# Patient Record
Sex: Female | Born: 1945 | Race: Black or African American | Hispanic: No | Marital: Married | Smoking: Never smoker
Health system: Southern US, Community
[De-identification: ages and names within clinical notes are randomized; demographics above are authoritative.]

## PROBLEM LIST (undated history)

## (undated) DIAGNOSIS — E119 Type 2 diabetes mellitus without complications: Secondary | ICD-10-CM

## (undated) DIAGNOSIS — I639 Cerebral infarction, unspecified: Secondary | ICD-10-CM

## (undated) DIAGNOSIS — F329 Major depressive disorder, single episode, unspecified: Secondary | ICD-10-CM

## (undated) DIAGNOSIS — G40909 Epilepsy, unspecified, not intractable, without status epilepticus: Secondary | ICD-10-CM

## (undated) DIAGNOSIS — K219 Gastro-esophageal reflux disease without esophagitis: Secondary | ICD-10-CM

## (undated) DIAGNOSIS — F32A Depression, unspecified: Secondary | ICD-10-CM

## (undated) DIAGNOSIS — F0280 Dementia in other diseases classified elsewhere without behavioral disturbance: Secondary | ICD-10-CM

## (undated) DIAGNOSIS — E785 Hyperlipidemia, unspecified: Secondary | ICD-10-CM

## (undated) DIAGNOSIS — I10 Essential (primary) hypertension: Secondary | ICD-10-CM

## (undated) DIAGNOSIS — N390 Urinary tract infection, site not specified: Secondary | ICD-10-CM

## (undated) DIAGNOSIS — G309 Alzheimer's disease, unspecified: Secondary | ICD-10-CM

## (undated) DIAGNOSIS — R339 Retention of urine, unspecified: Secondary | ICD-10-CM

## (undated) HISTORY — PX: HEMIARTHROPLASTY SHOULDER FRACTURE: SUR653

## (undated) HISTORY — PX: APPENDECTOMY: SHX54

## (undated) HISTORY — PX: ABDOMINAL HYSTERECTOMY: SHX81

## (undated) HISTORY — PX: JOINT REPLACEMENT: SHX530

---

## 2005-12-25 ENCOUNTER — Ambulatory Visit: Payer: Self-pay | Admitting: Gastroenterology

## 2006-01-31 ENCOUNTER — Ambulatory Visit: Payer: Self-pay | Admitting: Gastroenterology

## 2006-06-13 ENCOUNTER — Ambulatory Visit: Payer: Self-pay

## 2006-07-11 ENCOUNTER — Ambulatory Visit: Payer: Self-pay | Admitting: Family Medicine

## 2007-06-16 ENCOUNTER — Ambulatory Visit: Payer: Self-pay

## 2008-06-22 ENCOUNTER — Ambulatory Visit: Payer: Self-pay

## 2009-08-09 ENCOUNTER — Ambulatory Visit: Payer: Self-pay

## 2010-03-05 ENCOUNTER — Emergency Department: Payer: Self-pay | Admitting: Emergency Medicine

## 2010-05-22 ENCOUNTER — Ambulatory Visit: Payer: Self-pay | Admitting: Unknown Physician Specialty

## 2010-05-30 ENCOUNTER — Inpatient Hospital Stay: Payer: Self-pay | Admitting: Unknown Physician Specialty

## 2011-01-18 ENCOUNTER — Encounter: Payer: Self-pay | Admitting: Internal Medicine

## 2011-01-18 ENCOUNTER — Ambulatory Visit (INDEPENDENT_AMBULATORY_CARE_PROVIDER_SITE_OTHER): Payer: 59 | Admitting: Internal Medicine

## 2011-01-18 DIAGNOSIS — H8309 Labyrinthitis, unspecified ear: Secondary | ICD-10-CM | POA: Insufficient documentation

## 2011-01-18 DIAGNOSIS — I10 Essential (primary) hypertension: Secondary | ICD-10-CM | POA: Insufficient documentation

## 2011-01-18 DIAGNOSIS — E119 Type 2 diabetes mellitus without complications: Secondary | ICD-10-CM | POA: Insufficient documentation

## 2011-01-23 NOTE — Assessment & Plan Note (Signed)
Summary: INNER EARS  ITCHING   Vital Signs:  Patient Profile:   65 Years Old Female CC:      inner ear x 4 days Temp:     98.2 degrees F Pulse rate:   76 / minute Resp:     14 per minute BP sitting:   126 / 68  (left arm)  Pt. in pain?   no                   Current Allergies: ! ASPIRINHistory of Present Illness Chief Complaint: inner ear x 4 days History of Present Illness: Had same thing last spring which lasted a few weeks after rx per Dr. Thana Ates who is on vacation today. Has unsteadiness but no vertigo. Her ears itch and pop and she is sneezing alot. Used to take allegra but it stopped helping. hearing is no worse than usual.  REVIEW OF SYSTEMS Constitutional Symptoms      Denies fever, chills, night sweats, weight loss, weight gain, and fatigue.  Eyes       Denies change in vision, eye pain, eye discharge, glasses, contact lenses, and eye surgery. Ear/Nose/Throat/Mouth       Complains of change in hearing and dizziness.      Denies hearing loss/aids, ear pain, ear discharge, frequent runny nose, frequent nose bleeds, sinus problems, sore throat, hoarseness, and tooth pain or bleeding.  Respiratory       Denies dry cough, productive cough, wheezing, shortness of breath, asthma, bronchitis, and emphysema/COPD.  Cardiovascular       Denies murmurs, chest pain, and tires easily with exhertion.    Gastrointestinal       Denies stomach pain, nausea/vomiting, diarrhea, constipation, blood in bowel movements, and indigestion. Genitourniary       Denies painful urination, kidney stones, and loss of urinary control. Neurological       Denies paralysis, seizures, and fainting/blackouts. Musculoskeletal       Denies muscle pain, joint pain, joint stiffness, decreased range of motion, redness, swelling, muscle weakness, and gout.  Skin       Denies bruising, unusual mles/lumps or sores, and hair/skin or nail changes.  Psych       Denies mood changes, temper/anger issues,  anxiety/stress, speech problems, depression, and sleep problems.  Past History:  Social History: Last updated: 01/18/2011 Retired Never Smoked Alcohol use-no Drug use-no  Past Medical History: Diabetes mellitus, type II Hypertension gastritis  Past Surgical History: knee replacement  Social History: Retired Never Smoked Alcohol use-no Drug use-no Smoking Status:  never Drug Use:  no Physical Exam General appearance: well developed, well nourished, no acute distress Head: normocephalic, atraumatic Eyes: conjunctivae and lids normal. slight increase in bilateral nystagmus Pupils: equal, round, reactive to light Ears: normal, no lesions or deformities Nasal: mucosa sl red Oral/Pharynx: tongue normal, posterior pharynx without erythema or exudate Neck: neck supple,  trachea midline, no nodes. carotids 2+  Chest/Lungs: no rales, wheezes, or rhonchi bilateral, breath sounds equal without effort Heart: regular rate and  rhythm Extremities: normal extremities Neurological: grossly intact and non-focal. gait sl unsteady but no lean tendency Skin: no obvious rashes MSE: oriented to time, place, and person Assessment New Problems: HYPERTENSION (ICD-401.9) DIABETES MELLITUS, TYPE II (ICD-250.00) LABYRINTHITIS, ACUTE (ICD-386.30)   Plan New Medications/Changes: MECLIZINE HCL 25 MG TABS (MECLIZINE HCL) 1 by mouth four times daily as needed dizziness  #30 x 0, 01/18/2011, J. Juline Patch MD   The patient and/or caregiver has been counseled thoroughly  with regard to medications prescribed including dosage, schedule, interactions, rationale for use, and possible side effects and they verbalize understanding.  Diagnoses and expected course of recovery discussed and will return if not improved as expected or if the condition worsens. Patient and/or caregiver verbalized understanding.  Prescriptions: MECLIZINE HCL 25 MG TABS (MECLIZINE HCL) 1 by mouth four times daily as needed  dizziness  #30 x 0   Entered and Authorized by:   J. Juline Patch MD   Signed by:   Shela Commons. Juline Patch MD on 01/18/2011   Method used:   Electronically to        [facility] (retail)       [address], [address]       [place], [place]  [address]       Ph: [phone]       Fax: [phone]   RxID:   [id number]   Patient Instructions: 1)  try claritin 10 mg once daily as needed nose and ear itching, congestion, and allergies. 2)  Please schedule an appointment with your primary doctor in : 4 days as previously planned. 3)  If severe headache, loss of consciousness, inability to walk, weakness of one side, call EMS to go to emergency.

## 2011-02-04 ENCOUNTER — Inpatient Hospital Stay: Payer: Self-pay | Admitting: Specialist

## 2011-02-06 NOTE — Letter (Signed)
Summary: History Form  History Form   Imported By: Eugenio Hoes 01/29/2011 11:58:07  _____________________________________________________________________  External Attachment:    Type:   Image     Comment:   External Document

## 2011-02-14 ENCOUNTER — Encounter: Payer: Self-pay | Admitting: Family Medicine

## 2011-07-29 ENCOUNTER — Emergency Department: Payer: Self-pay | Admitting: Internal Medicine

## 2011-08-24 ENCOUNTER — Emergency Department: Payer: Self-pay | Admitting: Unknown Physician Specialty

## 2012-07-21 ENCOUNTER — Observation Stay: Payer: Self-pay | Admitting: Internal Medicine

## 2012-07-21 LAB — COMPREHENSIVE METABOLIC PANEL
Albumin: 3 g/dL — ABNORMAL LOW (ref 3.4–5.0)
Alkaline Phosphatase: 83 U/L (ref 50–136)
Anion Gap: 10 (ref 7–16)
BUN: 15 mg/dL (ref 7–18)
Bilirubin,Total: 0.3 mg/dL (ref 0.2–1.0)
Chloride: 105 mmol/L (ref 98–107)
Co2: 25 mmol/L (ref 21–32)
Creatinine: 1.08 mg/dL (ref 0.60–1.30)
SGPT (ALT): 29 U/L (ref 12–78)
Sodium: 140 mmol/L (ref 136–145)
Total Protein: 6.9 g/dL (ref 6.4–8.2)

## 2012-07-21 LAB — CBC WITH DIFFERENTIAL/PLATELET
Basophil #: 0 10*3/uL (ref 0.0–0.1)
Lymphocyte #: 2.6 10*3/uL (ref 1.0–3.6)
Lymphocyte %: 36.6 %
MCH: 21.5 pg — ABNORMAL LOW (ref 26.0–34.0)
MCV: 68 fL — ABNORMAL LOW (ref 80–100)
Monocyte %: 8.2 %
Neutrophil #: 3.6 10*3/uL (ref 1.4–6.5)
Neutrophil %: 51 %
Platelet: 180 10*3/uL (ref 150–440)
RDW: 15.4 % — ABNORMAL HIGH (ref 11.5–14.5)
WBC: 7 10*3/uL (ref 3.6–11.0)

## 2012-07-21 LAB — URINALYSIS, COMPLETE
Ketone: NEGATIVE
Leukocyte Esterase: NEGATIVE
Nitrite: NEGATIVE
Ph: 6 (ref 4.5–8.0)
Protein: NEGATIVE
Specific Gravity: 1.016 (ref 1.003–1.030)
WBC UR: 1 /HPF (ref 0–5)

## 2012-07-21 LAB — CK TOTAL AND CKMB (NOT AT ARMC)
CK, Total: 38 U/L (ref 21–215)
CK-MB: <0.5 ng/mL — ABNORMAL LOW (ref 0.5–3.6)

## 2012-07-21 LAB — APTT: Activated PTT: 37.9 secs — ABNORMAL HIGH (ref 23.6–35.9)

## 2012-07-22 LAB — CBC WITH DIFFERENTIAL/PLATELET
Basophil #: 0 10*3/uL (ref 0.0–0.1)
Eosinophil %: 5.3 %
HCT: 39 % (ref 35.0–47.0)
Lymphocyte #: 2.4 10*3/uL (ref 1.0–3.6)
MCH: 21.5 pg — ABNORMAL LOW (ref 26.0–34.0)
MCHC: 31.4 g/dL — ABNORMAL LOW (ref 32.0–36.0)
MCV: 69 fL — ABNORMAL LOW (ref 80–100)
Monocyte #: 0.6 x10 3/mm (ref 0.2–0.9)
Neutrophil #: 2.9 10*3/uL (ref 1.4–6.5)
RDW: 15.3 % — ABNORMAL HIGH (ref 11.5–14.5)

## 2012-07-22 LAB — BASIC METABOLIC PANEL
BUN: 13 mg/dL (ref 7–18)
Calcium, Total: 8.9 mg/dL (ref 8.5–10.1)
EGFR (African American): >60
EGFR (Non-African Amer.): >60
Glucose: 259 mg/dL — ABNORMAL HIGH (ref 65–99)
Osmolality: 287 (ref 275–301)
Potassium: 4 mmol/L (ref 3.5–5.1)

## 2012-08-01 ENCOUNTER — Ambulatory Visit: Payer: Self-pay | Admitting: Neurology

## 2012-08-06 ENCOUNTER — Encounter: Payer: Self-pay | Admitting: Family

## 2012-08-07 ENCOUNTER — Observation Stay: Payer: Self-pay | Admitting: Student

## 2012-08-07 LAB — URINALYSIS, COMPLETE
Bilirubin,UR: NEGATIVE
Glucose,UR: >=500 mg/dL (ref 0–75)
Ketone: NEGATIVE
Nitrite: NEGATIVE
Ph: 5 (ref 4.5–8.0)
Protein: 30
RBC,UR: 2 /HPF (ref 0–5)
Squamous Epithelial: 1

## 2012-08-07 LAB — COMPREHENSIVE METABOLIC PANEL
Alkaline Phosphatase: 91 U/L (ref 50–136)
Bilirubin,Total: 0.4 mg/dL (ref 0.2–1.0)
Calcium, Total: 9.2 mg/dL (ref 8.5–10.1)
Chloride: 104 mmol/L (ref 98–107)
EGFR (Non-African Amer.): >60
Glucose: 236 mg/dL — ABNORMAL HIGH (ref 65–99)
SGOT(AST): 26 U/L (ref 15–37)
SGPT (ALT): 23 U/L (ref 12–78)
Sodium: 141 mmol/L (ref 136–145)
Total Protein: 7.1 g/dL (ref 6.4–8.2)

## 2012-08-07 LAB — CBC WITH DIFFERENTIAL/PLATELET
Basophil %: 0.5 %
Eosinophil %: 4 %
HCT: 41.5 % (ref 35.0–47.0)
HGB: 13.6 g/dL (ref 12.0–16.0)
Lymphocyte #: 2.3 10*3/uL (ref 1.0–3.6)
MCH: 22.3 pg — ABNORMAL LOW (ref 26.0–34.0)
MCHC: 32.7 g/dL (ref 32.0–36.0)
MCV: 68 fL — ABNORMAL LOW (ref 80–100)
Monocyte #: 0.7 x10 3/mm (ref 0.2–0.9)
Monocyte %: 10.6 %
Neutrophil #: 3.1 10*3/uL (ref 1.4–6.5)
Platelet: 154 10*3/uL (ref 150–440)

## 2012-08-08 LAB — CBC WITH DIFFERENTIAL/PLATELET
Basophil #: 0 10*3/uL (ref 0.0–0.1)
Basophil %: 0.4 %
Eosinophil #: 0.2 10*3/uL (ref 0.0–0.7)
HCT: 41.8 % (ref 35.0–47.0)
HGB: 13.7 g/dL (ref 12.0–16.0)
Lymphocyte #: 1.9 10*3/uL (ref 1.0–3.6)
Lymphocyte %: 31.2 %
MCH: 22.3 pg — ABNORMAL LOW (ref 26.0–34.0)
MCHC: 32.9 g/dL (ref 32.0–36.0)
Monocyte #: 0.6 x10 3/mm (ref 0.2–0.9)
Monocyte %: 9 %
Neutrophil #: 3.5 10*3/uL (ref 1.4–6.5)
WBC: 6.2 10*3/uL (ref 3.6–11.0)

## 2012-08-08 LAB — COMPREHENSIVE METABOLIC PANEL
Albumin: 3.1 g/dL — ABNORMAL LOW (ref 3.4–5.0)
Alkaline Phosphatase: 80 U/L (ref 50–136)
BUN: 12 mg/dL (ref 7–18)
Bilirubin,Total: 0.4 mg/dL (ref 0.2–1.0)
Chloride: 104 mmol/L (ref 98–107)
Creatinine: 0.93 mg/dL (ref 0.60–1.30)
EGFR (African American): >60
EGFR (Non-African Amer.): >60
Glucose: 198 mg/dL — ABNORMAL HIGH (ref 65–99)
Osmolality: 285 (ref 275–301)
Potassium: 4 mmol/L (ref 3.5–5.1)
SGOT(AST): 20 U/L (ref 15–37)
Sodium: 140 mmol/L (ref 136–145)
Total Protein: 6.7 g/dL (ref 6.4–8.2)

## 2012-11-13 ENCOUNTER — Encounter: Payer: Self-pay | Admitting: Family Medicine

## 2012-12-01 ENCOUNTER — Inpatient Hospital Stay: Payer: Self-pay | Admitting: Internal Medicine

## 2012-12-01 LAB — CBC WITH DIFFERENTIAL/PLATELET
Basophil #: 0.1 10*3/uL (ref 0.0–0.1)
HCT: 41 % (ref 35.0–47.0)
Lymphocyte #: 1.2 10*3/uL (ref 1.0–3.6)
Lymphocyte %: 6.4 %
MCHC: 31.4 g/dL — ABNORMAL LOW (ref 32.0–36.0)
MCV: 67 fL — ABNORMAL LOW (ref 80–100)
Monocyte #: 0.9 x10 3/mm (ref 0.2–0.9)
Monocyte %: 4.6 %
Neutrophil #: 16.7 10*3/uL — ABNORMAL HIGH (ref 1.4–6.5)
Platelet: 141 10*3/uL — ABNORMAL LOW (ref 150–440)
RBC: 6.1 10*6/uL — ABNORMAL HIGH (ref 3.80–5.20)
RDW: 15.6 % — ABNORMAL HIGH (ref 11.5–14.5)
WBC: 18.9 10*3/uL — ABNORMAL HIGH (ref 3.6–11.0)

## 2012-12-01 LAB — COMPREHENSIVE METABOLIC PANEL
Alkaline Phosphatase: 90 U/L (ref 50–136)
BUN: 10 mg/dL (ref 7–18)
Calcium, Total: 9 mg/dL (ref 8.5–10.1)
Co2: 25 mmol/L (ref 21–32)
Creatinine: 0.96 mg/dL (ref 0.60–1.30)
EGFR (Non-African Amer.): >60
Osmolality: 280 (ref 275–301)
Potassium: 4 mmol/L (ref 3.5–5.1)
SGPT (ALT): 26 U/L (ref 12–78)
Sodium: 139 mmol/L (ref 136–145)

## 2012-12-01 LAB — URINALYSIS, COMPLETE
Bilirubin,UR: NEGATIVE
Ketone: NEGATIVE
Nitrite: NEGATIVE
Ph: 8 (ref 4.5–8.0)
Protein: 100
RBC,UR: 69 /HPF (ref 0–5)
Specific Gravity: 1.011 (ref 1.003–1.030)
Squamous Epithelial: NONE SEEN

## 2012-12-01 LAB — TROPONIN I: Troponin-I: <0.02 ng/mL

## 2012-12-01 LAB — PROTIME-INR: INR: 1.2

## 2012-12-01 LAB — MAGNESIUM: Magnesium: 1.3 mg/dL — ABNORMAL LOW

## 2012-12-01 LAB — TSH: Thyroid Stimulating Horm: 1.44 u[IU]/mL

## 2012-12-01 LAB — CK TOTAL AND CKMB (NOT AT ARMC)
CK, Total: 46 U/L (ref 21–215)
CK-MB: 0.9 ng/mL (ref 0.5–3.6)

## 2012-12-02 LAB — CBC WITH DIFFERENTIAL/PLATELET
Basophil #: 0 10*3/uL (ref 0.0–0.1)
Eosinophil #: 0.1 10*3/uL (ref 0.0–0.7)
Eosinophil %: 0.6 %
HCT: 38.2 % (ref 35.0–47.0)
HGB: 12 g/dL (ref 12.0–16.0)
Lymphocyte #: 2.5 10*3/uL (ref 1.0–3.6)
MCV: 68 fL — ABNORMAL LOW (ref 80–100)
Monocyte %: 7.6 %
Neutrophil #: 13.8 10*3/uL — ABNORMAL HIGH (ref 1.4–6.5)
Platelet: 130 10*3/uL — ABNORMAL LOW (ref 150–440)
WBC: 17.8 10*3/uL — ABNORMAL HIGH (ref 3.6–11.0)

## 2012-12-02 LAB — BASIC METABOLIC PANEL
Anion Gap: 6 — ABNORMAL LOW (ref 7–16)
BUN: 14 mg/dL (ref 7–18)
Calcium, Total: 9 mg/dL (ref 8.5–10.1)
Co2: 27 mmol/L (ref 21–32)
Creatinine: 1.02 mg/dL (ref 0.60–1.30)
EGFR (Non-African Amer.): 58 — ABNORMAL LOW
Glucose: 186 mg/dL — ABNORMAL HIGH (ref 65–99)
Osmolality: 287 (ref 275–301)
Potassium: 4 mmol/L (ref 3.5–5.1)

## 2012-12-02 LAB — MAGNESIUM: Magnesium: 1.7 mg/dL — ABNORMAL LOW

## 2012-12-03 LAB — URINE CULTURE

## 2012-12-04 LAB — CBC WITH DIFFERENTIAL/PLATELET
Basophil #: 0.1 10*3/uL (ref 0.0–0.1)
Basophil %: 0.9 %
Eosinophil #: 0.5 10*3/uL (ref 0.0–0.7)
Eosinophil %: 5.3 %
HCT: 39 % (ref 35.0–47.0)
Lymphocyte #: 2.3 10*3/uL (ref 1.0–3.6)
Lymphocyte %: 24.8 %
MCHC: 31.8 g/dL — ABNORMAL LOW (ref 32.0–36.0)
MCV: 68 fL — ABNORMAL LOW (ref 80–100)
Monocyte #: 0.8 x10 3/mm (ref 0.2–0.9)
Neutrophil #: 5.7 10*3/uL (ref 1.4–6.5)
Neutrophil %: 60 %
RBC: 5.77 10*6/uL — ABNORMAL HIGH (ref 3.80–5.20)
WBC: 9.4 10*3/uL (ref 3.6–11.0)

## 2012-12-05 LAB — CULTURE, BLOOD (SINGLE)

## 2012-12-06 ENCOUNTER — Encounter: Payer: Self-pay | Admitting: Family Medicine

## 2012-12-09 LAB — CULTURE, BLOOD (SINGLE)

## 2013-01-14 ENCOUNTER — Encounter: Payer: Self-pay | Admitting: Family Medicine

## 2013-02-02 ENCOUNTER — Emergency Department: Payer: Self-pay | Admitting: Emergency Medicine

## 2013-02-02 LAB — BASIC METABOLIC PANEL
BUN: 14 mg/dL (ref 7–18)
Calcium, Total: 8.8 mg/dL (ref 8.5–10.1)
Chloride: 105 mmol/L (ref 98–107)
Creatinine: 0.99 mg/dL (ref 0.60–1.30)
Glucose: 154 mg/dL — ABNORMAL HIGH (ref 65–99)
Potassium: 4.2 mmol/L (ref 3.5–5.1)
Sodium: 140 mmol/L (ref 136–145)

## 2013-02-02 LAB — CBC
HGB: 13.3 g/dL (ref 12.0–16.0)
MCHC: 31.7 g/dL — ABNORMAL LOW (ref 32.0–36.0)
Platelet: 168 10*3/uL (ref 150–440)
RBC: 6.31 10*6/uL — ABNORMAL HIGH (ref 3.80–5.20)

## 2013-02-02 LAB — URINALYSIS, COMPLETE
Bacteria: NONE SEEN
Bilirubin,UR: NEGATIVE
Blood: NEGATIVE
Ph: 7 (ref 4.5–8.0)
Specific Gravity: 1.014 (ref 1.003–1.030)
Squamous Epithelial: NONE SEEN

## 2013-02-03 ENCOUNTER — Encounter: Payer: Self-pay | Admitting: Family Medicine

## 2013-02-05 ENCOUNTER — Emergency Department: Payer: Self-pay | Admitting: Emergency Medicine

## 2013-02-05 LAB — URINALYSIS, COMPLETE
Bilirubin,UR: NEGATIVE
Blood: NEGATIVE
Ketone: NEGATIVE
Ph: 7 (ref 4.5–8.0)
Specific Gravity: 1.016 (ref 1.003–1.030)
Squamous Epithelial: 1

## 2013-02-05 LAB — COMPREHENSIVE METABOLIC PANEL
Albumin: 3.4 g/dL (ref 3.4–5.0)
Alkaline Phosphatase: 87 U/L (ref 50–136)
BUN: 13 mg/dL (ref 7–18)
Co2: 30 mmol/L (ref 21–32)
EGFR (African American): 56 — ABNORMAL LOW
EGFR (Non-African Amer.): 48 — ABNORMAL LOW
Glucose: 258 mg/dL — ABNORMAL HIGH (ref 65–99)
Osmolality: 283 (ref 275–301)

## 2013-02-05 LAB — CK TOTAL AND CKMB (NOT AT ARMC): CK, Total: 70 U/L (ref 21–215)

## 2013-02-05 LAB — CBC
HGB: 13.8 g/dL (ref 12.0–16.0)
Platelet: 183 10*3/uL (ref 150–440)

## 2013-02-05 LAB — TROPONIN I: Troponin-I: <0.02 ng/mL

## 2013-03-05 ENCOUNTER — Encounter: Payer: Self-pay | Admitting: Family Medicine

## 2013-04-02 ENCOUNTER — Emergency Department: Payer: Self-pay | Admitting: Emergency Medicine

## 2013-04-05 ENCOUNTER — Encounter: Payer: Self-pay | Admitting: Family Medicine

## 2013-05-05 ENCOUNTER — Encounter: Payer: Self-pay | Admitting: Family Medicine

## 2013-06-05 ENCOUNTER — Encounter: Payer: Self-pay | Admitting: Family Medicine

## 2013-07-06 ENCOUNTER — Encounter: Payer: Self-pay | Admitting: Family Medicine

## 2013-11-24 ENCOUNTER — Encounter: Payer: Self-pay | Admitting: Family Medicine

## 2013-12-06 ENCOUNTER — Encounter: Payer: Self-pay | Admitting: Family Medicine

## 2014-01-03 ENCOUNTER — Encounter: Payer: Self-pay | Admitting: Family Medicine

## 2014-02-03 ENCOUNTER — Encounter: Payer: Self-pay | Admitting: Family Medicine

## 2014-12-27 ENCOUNTER — Encounter: Payer: Self-pay | Admitting: Cardiology

## 2015-01-04 ENCOUNTER — Encounter: Payer: Self-pay | Admitting: Cardiology

## 2015-02-22 NOTE — Consult Note (Signed)
Referring Physician:  Samson Frederic   Primary Care Physician:  [facility], [address], [place], [place] [address], [place]  Ashok Norris : [facility], [address], [place], [place], [place] [address], [place] [id number]  Reason for Consult:  Admit Date: 07-Aug-2012   Reason for Consult: altered mental status   History of Present Illness:  History of Present Illness:   PATIENT NAMEMarland Kitchen  Wendy [name], Wendy [name] [id number] OF BIRTH:  Jul 16, 1946 OF ADMISSION:  08/07/2012 COMPLAINT: Altered mental status.  OF PRESENT ILLNESS: woman with a history of stroke and subsequent left hemiplegia presents with AMS and focal neurological deficits that were first noticed in the middle of the night by her husband.  Her husband woke her up to go to the bathroom in the middle of the night which is their routine and she seemed confused to him which was concerning.  He observed slurred speech and difficulty with coordination and gait.  He noticed some new worsening left leg weakness.  She also had what appeared to be some involuntary jerking movements of the extremiteis.  At this piont he took her to the ED.   has had an extensive workup to date including an MRI of the brain, TTE and carotid ultrasound, and is currently on ASA and Xarelto for secondary stroke prevention.  On admission she was found to have blood sugars into the 400s.   MEDICAL HISTORY: 1. History of previous cerebrovascular accidents. 2. Diabetes mellitus.  Hypertension.  Hyperlipidemia.  Urinary incontinence.  Gastroesophageal reflux disease.  Right leg deep vein thrombosis diagnosed 07/2012. Hypertrophic cardiomyopathy.  Allergic rash on neck. Depression. Microcytic anemia. Environmental allergies. SURGICAL HISTORY:  1. Left knee replacement.  2. Left rotator cuff surgery.  Hysterectomy.  Cholecystectomy.  Trigger finger on the right.  ALLERGIES: She has no known drug allergies.    1. Doxepin 10 mg daily at bedtime (new as of 10/01). 2. Xarelto 15 mg daily, dose decreased 10/01. Levemir 25 units at bedtime.  Aspirin 81 mg daily.  Bystolic 5 mg daily.  Protonix 40 mg daily.  Humalog sliding scale 5 units for blood sugar 100 to 150, 10 units for 151 to 200, 15 units for 201 to 250, 12 units if greater than 250.  Calcium 500 + D 500/400 mg/unit tablet twice a day.  Ipratropium bromide 0.03% solution, one spray each nostril every eight hours.  Crestor 5 mg daily.  Ditropan XL 5 mg daily. Fluoxetine 20 mg daily.  HISTORY:  Denies smoking, alcohol, or illicit drug use. Lives at home with husband.  HISTORY: Per records, congestive heart failure and myocardial infarction in mother and diabetes in father.  GENERAL.woman, with her husband.  Normocephalic and atraumatic. exam without pharmacologic dilation shows normal disc size, appearance and C/D ratio.  There is no papilledema. and S2 sounds are within normal limits, without murmurs, gallops, or rubs. - Obese- NormalDrift - Absent bilaterally.- Patient declines ambulation testing due to eating her lunch.    Shoulder abduction (deltoid/supraspinatus, axillary/suprascapular n, C5)   Elbow flexion (biceps brachii, musculoskeletal n, C5-6)   Elbow extension (triceps, radial n, C7)   Finger adduction (interossei, ulnar n, T1)    Hip flexion (iliopsoas, L1/L2)   Knee flexion (hamstrings, sciatic n, L5/S1)    Knee extension (quadriceps, femoral n, L3/4)   Ankle dorsiflexion (tibialis anterior, deep fibular n, L4/5)   Ankle plantarflexion (gastroc, tibial n, S1)  STATUS.is oriented x 2, correctly identifies the month and  year.  Recent and remote memory are mildly reduced.  Attention span and concentration are moderately reduced.  Naming, repetition, and comprehension are within normal limits.  Expressive speech and following commands is slow and mildly decreased.   NERVES.   CN II (normal visual acuity and visual fields)   CN III, IV, VI  (extraocular muscles are intact)   CN V (facial sensation is intact bilaterally)   CN VII (facial strength is intact bilaterally)   CN VIII (hearing is intact bilaterally)   CN IX/X (palate elevates midline, normal phonation)   CN XI (shoulder shrug strength is normal and symmetric)   CN XII (tongue protrudes midline) to pain and temp bilaterally (spinothalamic tracts)to position and vibration bilaterally (dorsal columns)    Biceps   Brachioradialis    Patellar   Achilles  Finger to nose testing is within normal limits bilaterally.  DATA: CBC shows WBC count 6.4, hemoglobin 13.6, hematocrit 41.5, platelets of 154, MCV 68. BMP shows glucose 236, BUN of 11, creatinine 0.95, sodium 141, potassium of 3.6, chloride of 104, bicarbonate of 29, calcium 9.2, bilirubin of 0.4, alkaline phosphatase 91, ALT of 23, AST of 26, total protein 7.1,  albumin 3.2, osmolality 288, anion gap 8.  shows yellow hazy urine with greater than 500 glucose. Negative ketones, nitrites, trace leukocyte esterase, pH of 5, 1+ blood, 2 RBCs per high-power field, 5 white blood cells per high-power field. No bacteria seen. One epithelial cell noted.  head shows no evidence of intracranial hemorrhage or extra-axial fluid collection. No evidence of mass effect or midline shift. Ventricles and sulci prominent due to the cortical atrophy. 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 An old right pons lacunar infarct is evident. Orbits are normal. The visualized sinuses are normal. Calcification is present in the visualized carotid arteries. The bones and soft tissues appear normal.  AND PLAN:  69 year old woman with a history of stroke and subsequent left hemiplegia presents with AMS and focal neurological deficits that were first noticed in the middle of the night by her husband.   evaluation.  Overall, I am concerned that she appears to have new neurologic deficits on exam that  are persistent.  This includes a mild expressive aphasia and confusion.  As such, she should have a brain MRI with and without contrast to evaluate for stroke or other intracranial structural or vascular lesion.    Hyperglycemia.  Her blood sugars were dramatically elevated upon admission.  Diabetics with poor blood sugar control are prone to having seizures and strokes more than the general population.  She shoudl work closely with her PCP to control her blood sugars, should measure her blood sugars regularly and be compliant with her PCP recommendations. evaluation.  Her history with slow but steady recovery certainly raises the possibility of seizure.  I would repeat a routine EEG to check for any ictal, interictal, or postictal findings to see if she may benefit from an anticonvulsant medication.   Melrose Nakayama, MD   ROS:   General denies complaints    HEENT no complaints    Lungs no complaints    Cardiac no complaints    GI no complaints    GU no complaints    Musculoskeletal no complaints    Extremities no complaints    Skin no complaints    Neuro no complaints    Endocrine no complaints   Past Medical/Surgical Hx:  cva: Pt had cva 02/2010, 01/2011 07/2012  GERD -  Esophageal Reflux:   HTN:   Diabetes:   shoulder:   gallbladder:   hysterectomy:   Home Medications: Medication Instructions Last Modified Date/Time  Aspirin Low Dose 81 mg oral delayed release tablet 1 tab(s) orally once a day 03-Oct-13 03:35  Humalog KwikPen 100 units/mL subcutaneous solution 5 unit(s) subcutaneous 3 times a day (with meals) as directed [id number] 84:13  Bystolic 5 mg oral tablet 1 tab(s) orally once a day 03-Oct-13 03:35  pantoprazole 40 mg oral enteric coated tablet 1 tab(s) orally once a day 03-Oct-13 03:35  Ditropan XL 5 mg/24 hours oral tablet, extended release 1 tab(s) orally once a day 03-Oct-13 03:35  fluoxetine 20 mg oral capsule 1 cap(s) orally once a day 03-Oct-13 03:35  Crestor 5 mg  oral tablet 1 tab(s) orally once a day (at bedtime) 03-Oct-13 03:35  Atrovent 21 mcg/inh nasal spray 2 spray(s) nasal 3 times a day, As Needed for rhinorrhea 03-Oct-13 03:35  Levemir 100 units/mL subcutaneous solution 25 unit(s) subcutaneous once a day (at bedtime) 03-Oct-13 03:35  Xarelto 15 mg oral tablet 1 tab(s) orally 2 times a day 03-Oct-13 03:35   Allergies:  No Known Allergies:   Vital Signs: **Vital Signs.:   03-Oct-13 11:25   Vital Signs Type Routine   Temperature Temperature (F) 97.9   Celsius 36.6   Temperature Source Oral   Pulse Pulse 73   Respirations Respirations 20   Systolic BP Systolic BP 244   Diastolic BP (mmHg) Diastolic BP (mmHg) 95   Mean BP 116   Pulse Ox % Pulse Ox % 91   Pulse Ox Activity Level  At rest   Oxygen Delivery Room Air/ 21 %   Lab Results:  Hepatic:  03-Oct-13 03:30    Bilirubin, Total 0.4   Alkaline Phosphatase 91   SGPT (ALT) 23   SGOT (AST) 26   Total Protein, Serum 7.1   Albumin, Serum  3.2  Routine Chem:  03-Oct-13 03:30    Glucose, Serum  236   BUN 11   Creatinine (comp) 0.95   Sodium, Serum 141   Potassium, Serum 3.6   Chloride, Serum 104   CO2, Serum 29   Calcium (Total), Serum 9.2   Osmolality (calc) 288   eGFR (African American) >60   eGFR (Non-African American) >60 (eGFR values <25m/min/1.73 m2 may be an indication of chronic kidney disease (CKD). Calculated eGFR is useful in patients with stable renal function. The eGFR calculation will not be reliable in acutely ill patients when serum creatinine is changing rapidly. It is not useful in  patients on dialysis. The eGFR calculation may not be applicable to patients at the low and high extremes of body sizes, pregnant women, and vegetarians.)   Anion Gap 8  Routine UA:  03-Oct-13 04:26    Color (UA) Yellow   Clarity (UA) Hazy   Glucose (UA) >=500   Bilirubin (UA) Negative   Ketones (UA) Negative   Specific Gravity (UA) 1.017   Blood (UA) 1+   pH (UA) 5.0    Protein (UA) 30 mg/dL   Nitrite (UA) Negative   Leukocyte Esterase (UA) Trace (Result(s) reported on 07 Aug 2012 at 05:06AM.)   RBC (UA) 2 /HPF   WBC (UA) 5 /HPF   Bacteria (UA) NONE SEEN   Epithelial Cells (UA) 1 /HPF   Mucous (UA) PRESENT (Result(s) reported on 07 Aug 2012 at 05:06AM.)  Routine Hem:  03-Oct-13 03:30    WBC (CBC) 6.4   RBC (CBC)  6.10   Hemoglobin (CBC) 13.6   Hematocrit (CBC) 41.5   Platelet Count (CBC) 154   MCV  68   MCH  22.3   MCHC 32.7   RDW  15.1   Neutrophil % 48.8   Lymphocyte % 36.1   Monocyte % 10.6   Eosinophil % 4.0   Basophil % 0.5   Neutrophil # 3.1   Lymphocyte # 2.3   Monocyte # 0.7   Eosinophil # 0.3   Basophil # 0.0 (Result(s) reported on 07 Aug 2012 at 03:45AM.)   Radiology Results: CT:    03-Oct-13 03:57, CT Head Without Contrast   CT Head Without Contrast    REASON FOR EXAM:    difficulty walking, slurred speech  COMMENTS:       PROCEDURE: CT  - CT HEAD WITHOUT CONTRAST  - Aug 07 2012  3:57AM     RESULT: Comparison:  07/29/2011    Technique: Multiple axial images from the foramen magnum to the vertex   were obtained without IV contrast.    Findings:      There is no evidence of mass effect, midline shift, or extra-axial fluid   collections.  There is no evidence of a space-occupying lesion or   intracranial hemorrhage. There is no evidence of a cortical-based area of     acute infarction. There is an old right pontine lacunar infarct. There is   generalized cerebral atrophy. There is periventricular white matter low   attenuation likely secondary to microangiopathy.    The ventricles and sulci areappropriate for the patient's age. The basal   cisterns are patent.    Visualized portions of the orbits are unremarkable. The visualized   portions of the paranasal sinuses and mastoid air cells are unremarkable.   Cerebrovascular atherosclerotic calcifications are noted.    The osseous structures are  unremarkable.    IMPRESSION:      No acute intracranial process.        Dictation Site: 1          Verified By: Jennette Banker, M.D., MD   Electronic Signatures: Anabel Bene (MD)  (Signed 03-Oct-13 15:07)  Authored: REFERRING PHYSICIAN, Primary Care Physician, Consult, History of Present Illness, Review of Systems, PAST MEDICAL/SURGICAL HISTORY, HOME MEDICATIONS, ALLERGIES, NURSING VITAL SIGNS, LAB RESULTS, RADIOLOGY RESULTS   Last Updated: 03-Oct-13 15:07 by Anabel Bene (MD)

## 2015-02-22 NOTE — Discharge Summary (Signed)
PATIENT NAME:  Wendy [name], Wendy [name] MR#:  [id number] DATE OF BIRTH:  [birth year]  DATE OF ADMISSION:  08/07/2012 DATE OF DISCHARGE:  08/08/2012   CHIEF COMPLAINT: Altered mental status.   CONSULTANTS:  1. Dr. Malvin JohnsPotter from neurology. 2. OT. 3. Speech and swallow evaluation. 4. Physical therapy.   DISCHARGE DIAGNOSES:  1. Altered mental status, likely from seizure, less likely transient ischemic attack.  2. Diabetes.  3. Hypertension.  4. Recent right lower extremity deep venous thrombosis.  5. Possible undiagnosed dementia.  6. History of previous cerebrovascular accidents.  7. Hyperlipidemia.  8. Urinary incontinence.  9. Gastroesophageal reflux disease.  10. Hypertrophic cardiomyopathy.  11. Depression.  12. Macrocytic anemia.  13. Environmental allergies.   DISCHARGE MEDICATIONS:  1. Aspirin 81 mg daily.  2. Humalog 5 units subcutaneous 3 times a day with meals.  3. Protonix 40 mg daily.  4. Crestor 5 mg daily.  5. Atrovent 21 micrograms inhaled nasal spray, two sprays 3 times a day as needed for rhinorrhea. 6. Levemir 25 units at bedtime.  7. Xarelto 20 mg once a day.  8. Nebivolol 10 mg once a day.  9. Lisinopril 10 mg daily.  10. Keppra 500 mg every 12 hours.  11. Amlodipine 10 mg daily.  12. Note: Please stop Ditropan, fluoxetine.   DIET: Low sodium, low fat, low cholesterol, consistent carb ADA diet.   ACTIVITY: As tolerated.   FOLLOWUP:  1. Please follow with your primary care physician for blood pressure and diabetes check up within 1 to 2 weeks.  2. Please follow with Dr. Malvin JohnsPotter from neurology within a week.   DISPOSITION: The patient will be going to rehab facility, [facility].  CODE STATUS: FULL CODE.   HISTORY OF PRESENT ILLNESS: For full details of the History and Physical, please see the dictation on 08/07/2012 by Dr. Marc Morganskafor, but briefly this is a 69 year old female with history of multiple cerebrovascular accidents with left-sided weakness, recent  transient ischemic attack and a cerebrovascular accident, evaluated here on 09/16 through 09/17 who presents with decreased mentation started about 1:00 with some unsteadiness on the feet, head jerking and some weakness and was admitted to the hospitalist service for further evaluation and management. Of note, the patient did have an EEG done on 08/05/2012 showing a seizure focus in central and right posterior frontal region without seizure activity. She has had a MRI and some work-up recently as well and is on Xarelto for deep vein thrombosis.   LABORATORY, DIAGNOSTIC AND RADIOLOGICAL DATA: Creatinine on arrival 0.95. Glucose 236 on arrival. Creatinine on discharge 0.93. LFTs: Albumin 3.2, otherwise within normal limits on admission. Initial WBC 6.4, hemoglobin 13.6, platelets 154. CT of the head without contrast showing no acute intracranial process. There is an old right pontine lacunar infarct. There is generalized cerebral atrophy, periventricular white matter low attenuation likely secondary to microangiopathy.   HOSPITAL COURSE: The patient was admitted to the hospitalist service. Altered mental status likely could be secondary to seizure. The patient did have a CT of the head which was negative for a stroke. She has had an echocardiogram and a MRI recently showing possible punctate infarcts. She is on Xarelto, aspirin and a statin. The patient was seen by Dr. Malvin JohnsPotter from neurology. Given that the patient had a positive EEG recently with mild confusion post event possibly being postictal state, head jerking, she likely had a seizure. However, transient ischemic attack or stroke is also a possibility. Per my conversation with Dr. Malvin JohnsPotter today,  he no longer recommend MRI or an EEG. His recommendations are to start Keppra, resume the aspirin, Xarelto and a statin and make appointment for the patient for next week with him for further follow-up. The patient would also need to hold the depression medications  and doxepin as well as Ditropan which could decrease seizure activity. The patient does have multiple strokes in the past and the possibility of some cognitive decline and dysfunction, possibly dementia and multi-infarct related is there and would need cognitive testing as an outpatient to rule this out.   The patient had her Levemir and Humalog resumed with sliding scale insulin for her diabetes. Her blood pressure was labile and Bystolic was increased. Amlodipine and lisinopril were added here. Otherwise, she did not have any seizure activity or other complaints. She will be discharged to [facility] today.   TOTAL TIME SPENT: 45 minutes.   CODE STATUS: The patient is FULL CODE.    ____________________________ [name], MD sa:ap D: 08/08/2012 13:37:36 ET T: 08/08/2012 14:05:47 ET JOB#: [id number]  cc: [name], MD, <Dictator> Dennison Mascot, MD Paulita Fujita Malvin Johns, MD [name] MD ELECTRONICALLY SIGNED 08/19/2012 17:57

## 2015-02-22 NOTE — H&P (Signed)
PATIENT NAME:  Wendy [name], Wendy [name] MR#:  [id number] DATE OF BIRTH:  Jul 12, 1946  DATE OF ADMISSION:  08/07/2012  CHIEF COMPLAINT: Altered mental status.   HISTORY OF PRESENT ILLNESS: 69 year old African American female with history of cerebrovascular accident with left residual weakness, recent transient ischemic attack/ cerebrovascular accident evaluation here 09/16 through 09/17 who presents with decreased mental status that developed at 1:00 a.m.  History is obtained primarily from the husband. Per husband, the patient was in her usual state of health today, went to physical therapy. However, he did notice that she seemed a little bit weaker and had some urinary retention. He notes that usually during the day he assists her to the bathroom to void every two hours and at night it is every three hours. Tonight at 1 a.m. he woke her up to assist her to the bathroom; however, she seemed confused and was "out of it". He characterizes this as saying that she had some slurred speech. She was holding her walker inappropriately. She had some gait disturbance and was not moving her left leg as well as she usually does so he was concerned for her. As he was getting her dressed to come to the Emergency Department, he noted that her head jerked a little bit to the left maybe twice. He did not note any generalized shaking episodes. But he did note that she was confused and that the confusion seems to be clearing up now that she has been in the Emergency Department for about three hours. She has incontinence at baseline but she did not have any incontinence during this episode, no tongue biting and no generalized shakes. She has been complaining of frontal headaches.   Review of records shows that the patient had an EEG on October 01 that showed seizure focus in the central and right posterior frontal region without seizure activity. During her hospitalization in September she had an MRI that could not exclude punctate foci of  ischemia in the right and left lateral periventricular regions.  It also showed white matter changes most consistent with chronic ischemia. She had an echocardiogram that did not show PFO.  She had carotid ultrasounds bilaterally that did not have evidence of hemodynamically significant stenosis For her cerebrovascular accident she is managed on aspirin and Xarelto, with which the patient has been compliant. Her diabetes regimen was recently adjusted as she had been having hypoglycemic episodes, so she is on Levemir 25 units at bedtime. However, today per her husband she was hyperglycemic to about 400 on presentation here. Initial BMP shows that sugar was 236.   The patient was evaluated at her primary care physician's, it seems, on 10/01 and was started on doxepin 10 mg, which the patient took earlier in the day. The husband feels that this was started for hives.  Of note, her Xarelto has been decreased to 15 mg daily.  We are called to admit for concern of a transient ischemic attack.   PAST MEDICAL HISTORY: 1. History of previous cerebrovascular accidents. 2. Diabetes mellitus.  3. Hypertension.  4. Hyperlipidemia.  5. Urinary incontinence.  6. Gastroesophageal reflux disease.  7. Right leg deep vein thrombosis diagnosed 07/2012. 8. Hypertrophic cardiomyopathy.  9. Allergic rash on neck. 10. Depression. 11. Microcytic anemia. 12. Environmental allergies.  PAST SURGICAL HISTORY:  1. Left knee replacement.  2. Left rotator cuff surgery.  3. Hysterectomy.  4. Cholecystectomy.  5. Trigger finger on the right.  ALLERGIES: She has no known drug allergies.  MEDICATIONS:  1. Doxepin 10 mg daily at bedtime (new as of 10/01). 2. Xarelto 15 mg daily, dose decreased 10/01. 3. Levemir 25 units at bedtime.  4. Aspirin 81 mg daily.  5. Bystolic 5 mg daily.  6. Protonix 40 mg daily.  7. Humalog sliding scale 5 units for blood sugar 100 to 150, 10 units for 151 to 200, 15 units for 201 to 250,  12 units if greater than 250.  8. Calcium 500 + D 500/400 mg/unit tablet twice a day.  9. Ipratropium bromide 0.03% solution, one spray each nostril every eight hours.  10. Crestor 5 mg daily.  11. Ditropan XL 5 mg daily. 12. Fluoxetine 20 mg daily.   SOCIAL HISTORY:  Denies smoking, alcohol, or illicit drug use. Lives at home with husband.   FAMILY HISTORY: Per records, congestive heart failure and myocardial infarction in mother and diabetes in father.   REVIEW OF SYSTEMS: Difficult to obtain given mental status. However, it was obtained from combined effort of the husband and the patient. CONSTITUTIONAL: Denies fever. Admits to weakness. EYES: Admits to blurred vision that she feels is acute. ENT: Admits to left ear pain, but no tinnitus. RESPIRATORY: Denies cough, wheezing, or hemoptysis. Admits to dyspnea.  CARDIOVASCULAR: Denies chest pain or edema. GI: Denies nausea, vomiting, or diarrhea. Admits to right-sided abdominal pain that is a dull ache. GU: There is incontinence. INTEGUMENT: She does have a new skin rash that developed over the last few days, red papules localized over her neck and on her left chin in no particular dermatomal pattern. These are nontender. NEURO: She denies numbness, does have cerebrovascular accident/transient ischemic attack history.  There was concern for some dysarthria and she did complain of headache.  PSYCH: She does have a history of depression.  PHYSICAL EXAMINATION:  VITAL SIGNS: Temperature 97.5, pulse 78, respirations 20, blood pressure 173/99, sating 96% on room air.   GENERAL: Well-appearing, well-kempt African American female in no apparent distress.   PSYCH: The patient is awake, somnolent, slow to respond. She is oriented times three. Given her somnolent status it is difficult to assess her judgment at this time. Believe her judgment is limited due to underlying cerebrovascular disease. She knows she is in the hospital, however does not know why  she is here. She is very soft spoken, unclear if this is because of her mental status versus baseline. Her husband states that she seems to be waking up more currently than how she presented.   HEENT: Normocephalic, atraumatic. Pupils equal, round, and reactive to light. Normal lids. Anicteric sclerae, normal external ears and nares. Posterior oropharynx is clear without exudate.    CARDIOVASCULAR: Normal S1, S2, regular rate and rhythm. She does have a systolic murmur at the right upper sternal border that radiates to the left upper sternal as well as left lower sternal borders, about 3/6. Minimal radiation to the carotids. She does have about 2+ pitting edema in the left lower extremity.   CHEST: Clear to auscultation bilaterally, normal effort.   ABDOMEN: Soft, nontender, nondistended. No hepatosplenomegaly. Normal bowel sounds.   NEUROLOGICAL: Cranial nerves II through XII appear intact with facial symmetry. Extraocular movements are intact. There is hearing to finger rub. There is equal palatal lift. Tongue is midline. Sternocleidomastoid strength is equal bilaterally. There is 5/5 strength in right upper and lower extremities. There is about 3+ to 4- in the left upper extremity, and about 4/5 strength in the left lower extremity. Finger-to-nose is slow on  both sides, but more so on the left. Sensation is intact on the face, upper extremities, and lower extremities.   MUSCULOSKELETAL: There is normal tone. There is no clubbing noted. Gait was not assessed.   SKIN: There are about 6 to 7 macular rashes on the neck and on the left jaw underneath her lip, nontender, blanchable. Rashes do not follow any dermatomal lines. Do not appear vesicular. Otherwise skin is warm and dry. No other lesions are noted.    LABORATORY DATA: CBC shows WBC count 6.4, hemoglobin 13.6, hematocrit 41.5, platelets of 154, MCV 68. BMP shows glucose 236, BUN of 11, creatinine 0.95, sodium 141, potassium of 3.6, chloride of  104, bicarbonate of 29, calcium 9.2, bilirubin of 0.4, alkaline phosphatase 91, ALT of 23, AST of 26, total protein 7.1,  albumin 3.2, osmolality 288, anion gap 8.   Urinalysis shows yellow hazy urine with greater than 500 glucose. Negative ketones, nitrites, trace leukocyte esterase, pH of 5, 1+ blood, 2 RBCs per high-power field, 5 white blood cells per high-power field. No bacteria seen. One epithelial cell noted.   CT head shows no evidence of intracranial hemorrhage or extra-axial fluid collection. No evidence of mass effect or midline shift. Ventricles and sulci prominent due to the cortical atrophy. 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 An old right pons lacunar infarct is evident. Orbits are normal. The visualized sinuses are normal. Calcification is present in the visualized carotid arteries. The bones and soft tissues appear normal.   ASSESSMENT AND PLAN: 69 year old female with history of cerebrovascular accident/transient ischemic attack presenting with altered mental status, etiology unclear, concerning for repeat cerebrovascular event, seizure, or even medication effect. 1. Altered mental status: The patient was recently evaluated extensively for her cerebrovascular accident with an MRI, echocardiogram, as well as carotid ultrasounds two weeks ago. CT of the head at this time is negative for acute hemorrhage. We will admit her, continue her Xarelto,  increase her aspirin, although review of records shows that she had some sensitivity to aspirin. However, when discussed with the husband, the husband cannot recall her having any problems with aspirin, so we will increase her dose to 325 mg aspirin despite the fact that she is on Xarelto.  We will continue her Crestor. We will aim for better glycemic goals as well as blood pressure goals. Given the EEG findings, [name] am concerned for possible seizure. We will place a neurology  consult to help clarify this and maybe consider repeat EEG. Given this event there might be consideration for initiation of antiepileptic drug therapy. We will consult physical therapy to see the patient. Given the adverse effect profile for doxepin, which includes confusion,  seizures, etc., [name] will hold this medication during this admission.  2. Diabetes with hyperglycemia: The patient has had recent hypoglycemic episodes so Levemir was decreased to 25 units at bedtime. We will continue this with her sliding scale and adjust as needed.  3. Hypertension: The patient is on Bystolic only. We shall add Norvasc and monitor her blood pressures.  4. Abdominal pain: The patient's right lower quadrant abdominal pain is somewhat nonspecific. Liver function tests are normal. At this time we will continue her PPI and reassess.  5. Gastroesophageal reflux disease: Protonix.  6. Microcytic anemia.  7. Incontinence: Continue Ditropan.   8. Depression: Continue fluoxetine. Hold doxepin.  9. Allergies: Continue her Atrovent intranasal as needed.   DISPOSITION: The patient is being admitted to observation status.  CODE STATUS: FULL CODE. Surrogate decision maker is her husband Mr. Gaetana MichaelisLarry Mark.  TIME SPENT: 60 minutes.   ____________________________ Aurther LoftAdaorah E. Lihanna Biever, DO [id number] D:  08/07/2012 05:57:47 ET          T: 08/07/2012 09:35:19 ET         JOB#: [id number]  cc: Aurther LoftAdaorah E. Taiten Brawn, DO, <Dictator> Dennison MascotLemont Morrisey, MD Hemang K. Sherryll BurgerShah, MD Jaymes Revels E Tekelia Kareem DO ELECTRONICALLY SIGNED 08/10/2012 1:15

## 2015-02-22 NOTE — Discharge Summary (Signed)
PATIENT NAME:  Wendy [name], Wendy [name] MR#:  [id number] DATE OF BIRTH:  02-07-1946  DATE OF ADMISSION:  07/21/2012 DATE OF DISCHARGE:  07/22/2012  ADMITTING PHYSICIAN: Hilda LiasVivek Sainani, MD  DISCHARGING PHYSICIAN: Enid Baasadhika Daisi Kentner, MD  PRIMARY CARE PHYSICIAN: Dennison MascotLemont Morrisey, MD  CONSULTANTS: None.  DISCHARGE DIAGNOSES:  1. Transient ischemic attack/cerebrovascular accident with possible punctate infarcts on MRI with resolution of her symptoms.  2. History of cerebrovascular accident with prior left-sided weakness.  3. Diabetes mellitus.  4. Hypertension.  5. Right leg deep venous thrombosis diagnosed two weeks ago, just started on Xarelto. 6. Allergic rash on neck. 7. Hyperlipidemia.  8. Depression.  9. Gastroesophageal reflux disease.   DISCHARGE MEDICATIONS:  1. Aspirin 81 mg p.o. daily. 2. Humalog KwikPen 5 units subcutaneous three times daily. 3. Bystolic 5 mg p.o. daily.  4. Pantoprazole 40 mg p.o. daily.  5. Ditropan XL 5 mg p.o. daily.  6. Fluoxetine 20 mg p.o. daily.  7. Crestor 5 mg p.o. at bedtime.  8. Levemir 25 units subcutaneous at bedtime.  9. Atrovent two sprays nasal three times a day as needed for rhinorrhea.  10. Xarelto 15 mg p.o. twice a day for three weeks starting from 07/19/2012 and then 20 mg p.o. daily.   DISCHARGE DIET: Low sodium, ADA 1800 diet.   DISCHARGE ACTIVITY: As tolerated.   FOLLOWUP INSTRUCTIONS:  1. PCP follow-up in 1 to 2 weeks.  2. [facility]. 3. Outpatient physical therapy.  LABS AND IMAGING STUDIES:  WBC 6.2, hemoglobin 12.2, hematocrit 39.0, AND platelet count 171.   Sodium 139, potassium 4.0, chloride 105, bicarbonate 28, BUN 13, creatinine 0.99, glucose 259, and calcium 8.9.   Urinalysis is negative for any infection.   Ultrasound carotid Doppler's bilaterally are showing no evidence of hemodynamically significant stenosis.   CT of the head is showing no acute intracranial process, chronic small  vessel ischemic disease.   MRI of the brain without contrast: Cannot exclude punctate foci of ischemia in right and left lateral periventricular regions, white matter changes most consistent with chronic ischemia.   BRIEF HOSPITAL COURSE: Ms. Wendy PotterDove is a 69 year old elderly African American female with past medical history significant for prior history of CVA with left-sided weakness, diabetes, and hypertension who [facility] and also deep venous thrombosis about two to three weeks ago. She was hospitalized for almost a week, according to the daughter, and was recently released. At the time, she had both weakness of her already weak left side and also some right-sided symptoms with some speech changes. She presents this time with further worsening of her left-sided weakness that started prior to admission.  1. Acute cerebrovascular accident/transient ischemic attack with worsening of her left-sided weakness. Her symptoms completely resolved and her speech is back to normal and her left-sided weakness is back to baseline at this time. She did work with physical therapy and did very well and they recommended outpatient physical therapy services. Her echocardiogram did not show any PFO the last time that it was done here. [facility] to three weeks ago for the same symptoms, but she did have a MRI which showed possible punctate infarcts in both right and left periventricular regions. However, since her symptoms are completely resolved, she is being discharged with outpatient neurology follow-up at this time. She was just started on Xarelto two to three days ago so will not further burden a lot of anticoagulation  on her medications so we discussed with the family and discharging her on Xarelto and aspirin at this time. She is already on a statin which we will continue at this time.  2. Diabetes mellitus. She is on Levemir and Humalog three times  daily with meals.  3. Right leg deep venous thrombosis. She was diagnosed about two weeks ago at [facility] and due to medication availability the patient was just started on Xarelto from 07/19/2012 and she is advised to continue it twice a day for three weeks and then 20 mg once a day and then followup at [facility].  4. All her other medications are being continued. Her course has been otherwise stable while in the hospital.  DISCHARGE CONDITION: Stable.   DISCHARGE DISPOSITION: Home.   TIME SPENT ON DISCHARGE: 45 minutes.  ____________________________ Enid Baas, MD rk:slb D: 07/22/2012 16:42:01 ET T: 07/23/2012 11:06:44 ET JOB#: [id number]  cc: Enid Baas, MD, <Dictator> Dennison Mascot, MD Enid Baas MD ELECTRONICALLY SIGNED 07/25/2012 17:05

## 2015-02-22 NOTE — H&P (Signed)
PATIENT NAME:  Wendy [name], Wendy [name] MR#:  [id number] DATE OF BIRTH:  February 06, 1946  DATE OF ADMISSION:  07/21/2012  PRIMARY CARE PHYSICIAN: Dennison Mascot, MD    CHIEF COMPLAINT: Left-sided weakness and slurred speech.   HISTORY OF PRESENT ILLNESS: This is a 69 year old female who presents to the Emergency Room due to acute onset of left-sided weakness and slurred speech. The patient's symptoms began earlier today when she was with her husband and they were running around doing errands. He noticed that she was more somnolent, not moving her left side, and was kind of slumped over. He called EMS and she was brought here. As per EMS, this patient is diabetic. She was not noted to be hypoglycemic but still had some concern for neurological symptoms, therefore, was brought to the ER. The patient in the ER remains hemodynamically stable and her symptoms have improved as far as her weakness is concerned. She does have a mild headache but no other associated symptoms presently. The patient's neurological symptoms have now resolved. She denies any numbness or tingling, any seizure-type activity. She does feel that her slurred speech has improved and her left-sided weakness has now completely resolved. Hospitalist services were contacted for further treatment and evaluation.   REVIEW OF SYSTEMS: CONSTITUTIONAL: No documented fever. No weight gain, no weight loss. EYES: No blurry or double vision. ENT: No tinnitus, no postnasal drip, no redness of the oropharynx. RESPIRATORY: No cough, no wheeze, no hemoptysis, no dyspnea. CARDIOVASCULAR: No chest pain, no orthopnea, no palpitations, no syncope. GI: No nausea, no vomiting, no diarrhea, no abdominal pain, no melena, no hematochezia. GU: No dysuria, no hematuria. ENDOCRINE: No polyuria or nocturia. No heat or cold intolerance. HEME: No anemia, no bruising, no bleeding. INTEGUMENTARY: No rashes, no lesions. MUSCULOSKELETAL: No arthritis, no swelling, no gout. NEUROLOGIC: No  numbness, no tingling, no ataxia, no seizure-type activity. PSYCH: No anxiety, no insomnia, no ADD.   PAST MEDICAL HISTORY:  1. History of previous cerebrovascular accident. 2. Diabetes. 3. Hypertension.  4. Hyperlipidemia.  5. Urinary incontinence. 6. Gastroesophageal reflux disease. 7. Acute DVT.  8. Hypertrophic cardiomyopathy.   ALLERGIES: No known drug allergies.   SOCIAL HISTORY: No smoking. No alcohol abuse. No illicit drug abuse. Lives at home with her husband.   FAMILY HISTORY: Mother died from complications of CHF and MI. Father died from complications of diabetes.   CURRENT MEDICATIONS:  1. Levemir 25 units at bedtime.  2. Aspirin 81 mg daily.  3. Atrovent two sprays nasally t.[name].d. as needed for rhinorrhea. 4. Bystolic 5 mg daily.  5. Crestor 5 mg at bedtime. 6. Ditropan XL 5 mg daily.  7. Fluoxetine 20 mg daily.  8. Humalog 5 units t.[name].d. as directed.  9. Protonix 40 mg daily.  10. Xarelto 15 mg b.[name].d.   PHYSICAL EXAMINATION ON ADMISSION:   VITAL SIGNS: Temperature 98.1, pulse 62, respirations 20, blood pressure 160/78, sats 97% on room air.   GENERAL: The patient is a pleasant appearing female in no apparent distress.   HEENT: Atraumatic, normocephalic. Her extraocular muscles are intact. Pupils equal and reactive to light. Sclerae anicteric. No conjunctival injection. No pharyngeal erythema.   NECK: Supple. No jugular venous distention, no bruits, no lymphadenopathy, no thyromegaly.   HEART: Regular rate and rhythm. She does have a 2 to 3 out of 6 outflow ejection murmur but no rubs, no clicks.   LUNGS: Clear to auscultation bilaterally. No rales, no rhonchi, no wheezes.   ABDOMEN: Soft, flat, nontender, nondistended. Has  good bowel sounds. No hepatosplenomegaly appreciated.   EXTREMITIES: No evidence of any cyanosis, clubbing, or peripheral edema. Has +2 pedal and radial pulses bilaterally.   NEUROLOGIC: She is alert, awake, and oriented x3. She does  have 4 out of 5 strength on the left upper extremity and left lower extremities. Babinski's are downgoing bilaterally. Reflexes are slightly hyperreflexic on the left side as compared to the right.   SKIN: Moist and warm with no rashes.   LYMPHATIC: There is no cervical or axillary lymphadenopathy.   LABORATORY, DIAGNOSTIC, AND RADIOLOGICAL DATA: Serum glucose 334, BUN 15, creatinine 1.08, sodium 140, potassium 4.0, chloride 105, bicarb 25. LFTs are within normal limits. Troponin less than 0.02. White cell count 7, hemoglobin 12.5, hematocrit 39.7, platelet count 180.   The patient did have a CT scan of the head done without contrast which showed no acute intracranial process. Some chronic small vessel ischemic disease.   ASSESSMENT AND PLAN: This is a 69 year old female with history of previous CVA, history of hypertrophic cardiomyopathy, hyperlipidemia, hypertension, gastroesophageal reflux disease, diabetes, urinary incontinence, history of recent acute DVT, and depression who presents to the hospital due to acute onset of left-sided weakness and slurred speech.  1. Left-sided weakness and slurred speech. Questionable if this is a CVA/TIA, likely TIA given the fact that the patient's symptoms have now resolved. The patient was on aspirin prior to coming in and [name] will switch her to Plavix for now. Will go ahead and obtain an MRI of her brain and also a carotid duplex. The patient was [facility] for neurological symptoms and underwent an extensive work-up including MRI and an echocardiogram which apparently showed a patent foramen ovale, although from our records from her echo in April of 2012 she does not have a PFO. [name] will await [facility] for now. Will follow neuro checks. Get a Neurology consult. Also get a physical therapy evaluation and follow her clinically.  2. Diabetes. [name] will continue her Levemir, Humalog with meals, follow her blood sugars, and  place on a carb-controlled diet.  3. Acute DVT. Continue her Xarelto.  4. Gastroesophageal reflux disease. Continue Protonix.  5. Depression. Continue Prozac.  6. Hyperlipidemia. Continue Crestor. 7. Hypertension. Continue with Bystolic.   CODE STATUS: The patient is a FULL CODE.   TIME SPENT: 50 minutes.   ____________________________ Rolly PancakeVivek J. Cherlynn KaiserSainani, MD [id number] D: 07/21/2012 17:40:32 ET T: 07/22/2012 05:55:28 ET JOB#: [id number]  cc: Rolly PancakeVivek J. Cherlynn KaiserSainani, MD, <Dictator> Dennison MascotLemont Morrisey, MD Houston SirenVIVEK J SAINANI MD ELECTRONICALLY SIGNED 08/01/2012 12:15

## 2015-02-25 NOTE — H&P (Signed)
PATIENT NAME:  Wendy [name], Wendy [name] MR#:  [id number] DATE OF BIRTH:  [birth year]  DATE OF ADMISSION:  12/01/2012  PRIMARY CARE PHYSICIAN: Dr. Kirke [name] at [facility] in [place]   CHIEF COMPLAINT: Weakness.   HISTORY OF PRESENT ILLNESS: This is a 69 year old female who was just discharged from [facility] 2 days ago after being there for weakness, was diagnosed possible Parkinson's disease at that point and discharged home on medications. According to her husband, she was doing fairly well over the last two days until last night when she started getting confused. Today she was much less responsive. She comes into the ER and is found to have a fever of 101.8 tachycardic, and to have a urinary tract infection, so, she appears to be septic. She did have urinary retention and required some [name] and O catheterizations when she was recently in the hospital. We are going to go ahead and admit her for further treatment.   PAST MEDICAL HISTORY: 1. History of seizure. 2. Diabetes.  3. Hypertension.  4. History of right lower  extremity DVT.  5. History of stroke.  6. Hyperlipidemia.  7. Urinary incontinence and retention.  8. GERD.  9. Hypertrophic cardiomyopathy.  10. Depression.  11. Possible Parkinson's disease.   ALLERGIES: No known drug allergies.   CURRENT MEDICATIONS: Aspirin 81 mg daily, Lipitor 20 mg daily, Lantus 32 units at bedtime, lispro 10 units t.[name].d. with meals, lisinopril 20 mg daily, magnesium oxide 400 mg b.[name].d.,  multivitamin daily, Protonix 40 mg daily, Zantac 300 mg b.[name].d., Xarelto 20 mg daily, Effexor 37.5 mg b.[name].d., Sinemet CR 25/100 mg b.[name].d., Lamictal 25 mg b.[name].d., potassium 20 mEq daily.   SOCIAL HISTORY: She lives with her husband. She does not smoke or drink alcohol.   FAMILY HISTORY: Significant for coronary artery disease and diabetes.   REVIEW OF SYSTEMS: Unable to obtain from the patient because of her altered mental status.   PHYSICAL EXAMINATION: VITAL SIGNS:  Temperature is 101.8, pulse is 118, respirations 22, blood pressure 127/90, pulse oximetry is 95% on room air.  GENERAL: This is a well-nourished white female who appears acutely ill.  HEENT: Pupils are equal, round, and reactive to light. Sclerae are anicteric. Oral mucosa is dry. Oropharynx is clear.  Nasopharynx is clear.  NECK: Supple. No JVD, lymphadenopathy. No thyromegaly.  CARDIOVASCULAR: Tachy with no murmurs.  LUNGS: Clear to auscultation. No dullness to percussion. She is not using accessory muscles.  ABDOMEN: Soft, nontender, nondistended. Bowel sounds are positive. No hepatosplenomegaly. No masses.  EXTREMITIES: There is no edema.  NEUROLOGICAL: Cranial nerves II through XII appear to be intact, although she is difficult to cooperate for the exam. She is mildly obtunded but does try to answer questions but not appropriately. She moves all extremities.  SKIN: Moist with no rash.   LABORATORY DATA: Urinalysis shows 2+ leukocyte esterase, 3+ bacteria, and 68 white blood cells. White blood cell count is 18.9, hemoglobin is 12.9. BUN is 10, creatinine 0.96, sodium 139, potassium 4, magnesium is 1.3.   ASSESSMENT AND PLAN: 1. Sepsis: This is likely from her urinary tract infection. She is tachycardic, has fever, high white blood cell count. We will give her IV fluids aggressively, treat underlying cause with antibiotics and give her whatever supportive care she may need.   2. Urinary tract infection: We will get urine and blood cultures. We will start her on some Rocephin, also also add vancomycin since she was just in the hospital as she could  have a nosocomial infection.  3. Hypomagnesemia:  She does take supplements but appears to be even lower. We will go ahead and give her some IV magnesium and continue her daily supplements.  4. Altered mental status: This is probably a metabolic encephalopathy from the infection.  5. History of seizures: We will go ahead and continue her medications  on the Lamictal.  6. Possible Parkinson's disease: She is on Sinemet. We will continue that.  7. Diabetes: [name] am going to continue her Lantus and put her on sliding scale since she is not eating at this point. [name] will discontinue her scheduled dose before each meal until she is able to take p.o. better.   TOTAL CRITICAL CARE TIME SPENT: 45 minutes.   ____________________________ Wendy NurseJohn D. Zaydrian Batta, MD [email or web address] D: 12/01/2012 17:30:43 ET T: 12/01/2012 18:31:22 ET JOB#: [id number]  cc: Wendy NurseJohn D. [name], MD, <Dictator> Wendy HousekeeperMario Ernesto Olmedo, MD Wendy NurseJOHN D Amyr Sluder MD ELECTRONICALLY SIGNED 12/02/2012 17:22

## 2015-02-25 NOTE — Discharge Summary (Signed)
PATIENT NAME:  Wendy [name], Wendy [name] MR#:  [id number] DATE OF BIRTH:  Feb 13, 1946  DATE OF ADMISSION:  12/01/2012 DATE OF DISCHARGE:  12/05/2012  PRIMARY CARE PHYSICIAN:  Dr. Thana AtesMorrisey  The patient is being [facility] to [facility].   PRESENTING COMPLAINT: Weakness.   DISCHARGE DIAGNOSES: 1.  Escherichia coli septicemia, source urine. 2.  Escherichia coli urinary tract infection.  3.  Uncontrolled diabetes in the setting of sepsis, improved now.  4.  Hypomagnesemia.  5.  Possible Parkinson disease.  6.  History of seizure disorder.  7.  History of deep venous thrombosis on Xarelto.  CONDITION ON DISCHARGE:  Fair.  VITAL SIGNS:  Stable. Blood pressure 152/93, sats 94% on room air. The patient is afebrile.   Repeat blood cultures negative. H and H is 12.4 and 39.0. White count is 9.4. 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 No acute abnormality. UA positive for UTI. Urine culture E. coli resistant to ampicillin. Blood cultures positive 4 out of 4 bottles E. coli. White count on admission was 18.9.   DISCHARGE MEDICATIONS: 1.  Tylenol 650 q. 4 p.r.n.  2.  Aspirin 81 mg daily.  3.  Lamictal 25 mg b.[name].d.  4.  Venlafaxine 75 mg b.[name].d.  5.  Atorvastatin 20 mg daily.  6.  Carbidopa levodopa 25/100 one tablet daily.  7.  Zantac 300 mg b.[name].d.  8.  Magnesium oxide 400 mg daily.  9.  Potassium chloride 20 mEq p.o. daily.  10.  Protonix 40 mg daily.  11.  Multivitamin p.o. daily.  12.  Xarelto 20 mg p.o. daily.  13.  Insulin Lantus 40 units subcutaneous at bedtime.  14.  Sliding scale insulin.  15.  Insulin lispro 10 units subcutaneous t.[name].d. at bedtime.  16.  Lactulose 30 mL t.[name].d. until patient has good bowel movement.  17.  Cefuroxime 500 mg b.[name].d. for 6 more days.  BRIEF SUMMARY OF HOSPITAL COURSE: The patient is a 69 year old African American female with past medical history of seizures and possible Parkinson disease  came in with: 1.  Gram-negative sepsis. The patient was admitted on the medical floor, started on IV fluids. She was started on broad-spectrum antibiotics with IV Rocephin 2 grams daily. Her blood cultures and urine cultures grew E. coli which was pan sensitive and Rocephin was continued. Her white count came down from 18,000 to 9.4. She remained afebrile. Repeat blood cultures remained negative in 18 to 24 hours. The patient remained stable. She will complete a course of p.o. antibiotic as outpatient for a total of 10 days. 2.  Escherichia coli urinary tract infection treatment as above.  3.  Hypomagnesemia: The patient was given supplementation with p.o. magnesium.  4. Altered mental status/metabolic encephalopathy was due to septicemia, improved prior to discharge.  5.  History of seizure disorder. She is on Lamictal.  6.  Possible Parkinson disease. She is on Sinemet.  7.  Type 2 diabetes, uncontrolled in the setting of sepsis. Sugars were stable prior to discharge. She was continued on her Lantus, Humalog and sliding scale.  8.  Constipation. Lactulose was given.  9.  History of right lower extremity deep vein thrombosis on Xarelto.  Hospital stay otherwise remained stable. The [facility] will [facility]. SHE IS A FULL CODE.   TIME SPENT:  40 minutes.  DIET:  2 grams sodium, ADA 1800 calorie diet.   PHYSICAL THERAPY:  As tolerated.  ____________________________ Wylie Hail Allena Katz, MD sap:ce D: 12/05/2012 12:27:57 ET T: 12/05/2012 12:46:54 ET JOB#: [id number]  cc: Ginnette Gates A. Allena Katz, MD, <Dictator>  Dr. Tamala Julian MD ELECTRONICALLY SIGNED 12/12/2012 7:07

## 2015-11-15 ENCOUNTER — Ambulatory Visit: Payer: Self-pay

## 2015-11-21 ENCOUNTER — Encounter: Payer: Self-pay | Admitting: Physical Therapy

## 2015-11-21 ENCOUNTER — Ambulatory Visit: Payer: Medicare Other | Attending: Family Medicine | Admitting: Physical Therapy

## 2015-11-21 ENCOUNTER — Ambulatory Visit: Payer: Self-pay

## 2015-11-21 DIAGNOSIS — I69898 Other sequelae of other cerebrovascular disease: Secondary | ICD-10-CM | POA: Diagnosis present

## 2015-11-21 DIAGNOSIS — R531 Weakness: Secondary | ICD-10-CM | POA: Diagnosis present

## 2015-11-21 DIAGNOSIS — IMO0002 Reserved for concepts with insufficient information to code with codable children: Secondary | ICD-10-CM

## 2015-11-21 DIAGNOSIS — R262 Difficulty in walking, not elsewhere classified: Secondary | ICD-10-CM | POA: Diagnosis not present

## 2015-11-21 NOTE — Therapy (Signed)
[facility] MAIN [facility] [address] [place], [place], [address] Phone: [phone]   Fax:  [phone]  Physical Therapy Evaluation  Patient Details  Name: Wendy [name] MRN: [id number] Date of Birth: [birth year] Referring Provider: Wyvonne LenzBarbara Aldrige  Encounter Date: 11/21/2015      PT End of Session - 11/21/15 0851    Visit Number 1   Number of Visits 17   Date for PT Re-Evaluation 01/16/16   Authorization Type g codes   PT Start Time 0830   PT Stop Time 0915   PT Time Calculation (min) 45 min   Equipment Utilized During Treatment Gait belt      No past medical history on file.  No past surgical history on file.  There were no vitals filed for this visit.  Visit Diagnosis:  Difficulty walking  Weakness due to cerebrovascular accident      Subjective Assessment - 11/21/15 0844    Subjective Patient had a CVA in April 2016 and then has had HHPT. She feels that she is walking less at home and has had 4 falls in the last 6 months. She had a 45 minute session for an evaluation but needed to use the rest room during the exam and therefor the exam was not completed and 2 outcome measures need to be completed at next visit.    Patient is accompained by: Family member   Pertinent History DM, CVA, head aches, UTI, SOB, memory impaired, left knee surgery, heart murmur   Currently in Pain? No/denies            [facility] PT Assessment - 11/21/15 0001    Assessment   Medical Diagnosis CVA   Referring Provider Wyvonne LenzBarbara Aldrige   Onset Date/Surgical Date 02/06/15   Hand Dominance Right   Next MD Visit 11/26/15   Prior Therapy out patient    Precautions   Precautions Fall   Restrictions   Weight Bearing Restrictions No   Balance Screen   Has the patient fallen in the past 6 months Yes   Has the patient had a decrease in activity level because of a fear of falling?  Yes   Is the patient reluctant to leave their home because of a fear  of falling?  No   Home Tourist information centre managernvironment   Living Environment Private residence   Living Arrangements Spouse/significant other   Available Help at Discharge Family   Type of Home House   Home Access Stairs to enter   Entrance Stairs-Number of Steps 3   Entrance Stairs-Rails Left;Right;Can reach both   Home Layout One level   Home Equipment RialtoWalker - 2 wheels;Bedside commode;Wheelchair - manual;Cane - quad;Shower seat   Prior Function   Level of Independence Needs assistance with transfers;Needs assistance with gait;Needs assistance with ADLs;Requires assistive device for independence                           PT Education - 11/21/15 0851    Education provided Yes   Education Details plan of care   Person(s) Educated Patient   Methods Explanation   Comprehension Verbalized understanding     PAIN: no reports of pain  POSTURE: flexed trunk and fwd head in standing   PROM/AROM: generalized strength is decreased in BLE and BUE  STRENGTH:  Graded on a 0-5 scale Muscle Group Left Right  Shoulder flex -3 -3  Shoulder Abd -3 -3  Shoulder Ext  Shoulder IR/ER    Elbow    Wrist/hand    Hip Flex 2 2  Hip Abd 2 2  Hip Add 2 2  Hip Ext 2 2  Hip IR/ER    Knee Flex 3 3  Knee Ext 3 3  Ankle DF 3 3  Ankle PF 3 3   SENSATION: intact  :FUNCTIONAL MOBILITY: transfers sit to stand with cues for correct hand placement, needs min assist for sit to supine bed mobility and supine to sit bed mobiity   BALANCE: poor static standing balance, poor dynamic standing balance with unable to perform tandem stand, unable to single leg stand, unable to stand with feet together   GAIT: Patient ambulates with RW with decreased gait speed, decreased step length and decreased step height.  OUTCOME MEASURES: TEST Outcome Interpretation  5 times sit<>stand 55.19.sec >25 yo, >15 sec indicates increased risk for falls  10 meter walk test    . 30             m/s <1.0 m/s indicates  increased risk for falls; limited community ambulator        Patient was scheduled for a 45 minute evaluation and needed to use the bathroom during the middle of the evaluation and was unable to finish the outcome measures needed for completing g codes. Patient will continue testing the outcome measures of TUG and 6 M W test at next visit.       PT Long Term Goals - 11/21/15 1610    PT LONG TERM GOAL #1   Title Patient will be independent in home exercise program to improve strength/mobility for better functional independence with ADLs   Baseline no HEP   Time 8   Period Weeks   Status New   PT LONG TERM GOAL #2   Title Patient (> 35 years old) will complete five times sit to stand test in < 15 seconds indicating an increased LE strength and improved balance   Baseline 55.19 sec   Time 8   Status New   PT LONG TERM GOAL #3   Title . Patient will increase 10 meter walk test to >1.66m/s as to improve gait speed for better community ambulation and to reduce fall risk   Baseline . 3 m/sec   Time 8   Period Weeks   Status New   PT LONG TERM GOAL #4   Title Patient will be independent with bed mobility sit to supine and supien to sit.   Baseline min assist for LE    Time 8   Period Weeks   Status New               Plan - 11/21/15 9604    Clinical Impression Statement Patient presents with decreased mobility and slowness of mobiity following 4 CVA's with the last one February 06, 2015. She lives at home wiht her husband and needs assist with ADL's and  variable levels of assist for transfers and ambulation with Rw. She has generalized weakness, decreased static and dynamic standing balance and will benefit from skilled PT to improve her falls risk and improve mobility.    Pt will benefit from skilled therapeutic intervention in order to improve on the following deficits Abnormal gait;Decreased cognition;Decreased coordination;Decreased mobility;Decreased activity tolerance;Decreased  endurance;Decreased strength;Decreased balance;Decreased safety awareness;Difficulty walking;Obesity   Rehab Potential Fair   PT Frequency 2x / week   PT Duration 8 weeks   PT Treatment/Interventions Balance training;Therapeutic exercise;Therapeutic activities;Functional mobility training;Gait training;Neuromuscular  re-education          G-Codes - 12-09-15 0920    Functional Assessment Tool Used 5 x sit to stand, 10 MW   Functional Limitation Mobility: Walking and moving around   Mobility: Walking and Moving Around Current Status [phone]) At least 60 percent but less than 80 percent impaired, limited or restricted   Mobility: Walking and Moving Around Goal Status [phone]) At least 40 percent but less than 60 percent impaired, limited or restricted       Problem List Patient Active Problem List   Diagnosis Date Noted  . DIABETES MELLITUS, TYPE II 01/18/2011  . LABYRINTHITIS, ACUTE 01/18/2011  . HYPERTENSION 01/18/2011    Ezekiel Ina 12-09-15, 9:29 AM  [facility] MAIN [facility] SERVICES [address] [place], [place], [address] Phone: [phone]   Fax:  [phone]  Name: Wendy [name] MRN: [id number] Date of Birth: 1946/01/11

## 2015-11-22 ENCOUNTER — Ambulatory Visit: Payer: Self-pay

## 2015-11-24 ENCOUNTER — Ambulatory Visit: Payer: Medicare Other | Admitting: Physical Therapy

## 2015-11-24 ENCOUNTER — Encounter: Payer: Self-pay | Admitting: Physical Therapy

## 2015-11-24 DIAGNOSIS — R262 Difficulty in walking, not elsewhere classified: Secondary | ICD-10-CM | POA: Diagnosis not present

## 2015-11-24 DIAGNOSIS — IMO0002 Reserved for concepts with insufficient information to code with codable children: Secondary | ICD-10-CM

## 2015-11-24 NOTE — Therapy (Signed)
[facility] MAIN [facility] SERVICES [address] [place], [place], [address] Phone: [phone]   Fax:  [phone]  Physical Therapy Treatment  Patient Details  Name: Wendy [name] MRN: [id number] Date of Birth: 01-17-1946 Referring Provider: Wyvonne Lenz  Encounter Date: 11/24/2015      PT End of Session - 11/24/15 1536    Visit Number 2   Number of Visits 17   Date for PT Re-Evaluation 02/15/2016   Authorization Type g codes2   Authorization Time Period 10   PT Start Time 1515   PT Stop Time 1600   PT Time Calculation (min) 45 min   Equipment Utilized During Treatment Gait belt   Activity Tolerance Patient tolerated treatment well;Patient limited by fatigue   Behavior During Therapy [facility] for tasks assessed/performed      History reviewed. No pertinent past medical history.  History reviewed. No pertinent past surgical history.  There were no vitals filed for this visit.  Visit Diagnosis:  Difficulty walking  Weakness due to cerebrovascular accident      Subjective Assessment - 11/24/15 1520    Subjective Patient reports doing okay today; she denies any pain;    Patient is accompained by: Family member   Pertinent History DM, CVA, head aches, UTI, SOB, memory impaired, left knee surgery, heart murmur   Currently in Pain? No/denies            [facility] PT Assessment - 11/24/15 0001    Timed Up and Go Test   Normal TUG (seconds) 111   TUG Comments 1 min 51 sed with RW; Patient required CGA for safety with turns and min Vcs to reach back for chair prior to sitting; >14 sec indicates increased risk for falls;       TREATMENT: PT initiated HEP Warm up on Nustep level 2 BUE/BLE x10 min with HEP instruction, 5 min unbilled;  Standing: Hip abduction x10 bilaterally with min VCS to avoid leaning to side for better hip strengthening; Side stepping 10 feet x3 laps each direction with min VCs to increase erect posture and increase  step length for hip strengthening; Heel raises x10; Alternate march using RW for balance, x1 min with mod VCs to increase hip flexion for increased strengthening; Sit<>Stand pushing from chair with BUE arm rests x10  Patient required min-moderate verbal/tactile cues for correct exercise technique. She was very slow performing exercise and required increased cues for better technique; Required 2 seated rest breaks due to fatigue;  Instructed patient in timed up and go- see above.                         PT Education - 11/24/15 1535    Education provided Yes   Education Details HEP   Person(s) Educated Patient   Methods Explanation;Verbal cues;Handout   Comprehension Verbalized understanding;Returned demonstration;Verbal cues required             PT Long Term Goals - 11/21/15 0925    PT LONG TERM GOAL #1   Title Patient will be independent in home exercise program to improve strength/mobility for better functional independence with ADLs   Baseline no HEP   Time 8   Period Weeks   Status New   PT LONG TERM GOAL #2   Title Patient (70 years old) will complete five times sit to stand test in < 15 seconds indicating an increased LE strength and improved balance   Baseline 55.19  sec   Time 8   Status New   PT LONG TERM GOAL #3   Title . Patient will increase 10 meter walk test to >1.72m/s as to improve gait speed for better community ambulation and to reduce fall risk   Baseline . 3 m/sec   Time 8   Period Weeks   Status New   PT LONG TERM GOAL #4   Title Patient will be independent with bed mobility sit to supine and supien to sit.   Baseline min assist for LE    Time 8   Period Weeks   Status New               Plan - 11/24/15 1542    Clinical Impression Statement Instructed patient in HEP; Patient very slow with LE movement requiring mod VCs to increase ROM for increaesd strengthening. Patient reports decreased mobility at home. She would  benefit from additional skilled PT intervention to improve LE strength, balance/gait safety;    Pt will benefit from skilled therapeutic intervention in order to improve on the following deficits Abnormal gait;Decreased cognition;Decreased coordination;Decreased mobility;Decreased activity tolerance;Decreased endurance;Decreased strength;Decreased balance;Decreased safety awareness;Difficulty walking;Obesity   Rehab Potential Fair   PT Frequency 2x / week   PT Duration 8 weeks   PT Treatment/Interventions Balance training;Therapeutic exercise;Therapeutic activities;Functional mobility training;Gait training;Neuromuscular re-education        Problem List Patient Active Problem List   Diagnosis Date Noted  . DIABETES MELLITUS, TYPE II 01/18/2011  . LABYRINTHITIS, ACUTE 01/18/2011  . HYPERTENSION 01/18/2011    Trotter,Margaret PT, DPT 11/24/2015, 4:19 PM  [facility] MAIN [facility] SERVICES [address] [place], [place], [address] Phone: [phone]   Fax:  [phone]  Name: PRISCILLA KIRSTEIN MRN: [id number] Date of Birth: [birth year]

## 2015-11-24 NOTE — Patient Instructions (Signed)
Balance, Proprioception: Hip Abduction With Tubing   With tubing attached to both ankles, Standing holding onto counter, kick one leg out to side and then Return.  Repeat _10___ times  On each side.  Do ___2_ sessions per day. *Do not use band, just work on kicking leg out to side;   http://cc.exer.us/20    Copyright  VHI. All rights reserved.  Band Walk: Side Stepping   Tie band around legs, just above knees. Step _10__ feet to one side, then step back to start. Repeat _2-3__ feet per session. Note: Small towel between band and skin eases rubbing. *Do not use band, just side step side/side; http://plyo.exer.us/76    March    March in place for _1__ minutes. Do _2__ times per day. Hold onto kitchen counter for balance if needed;   Copyright  VHI. All rights reserved.  Heel Raise (Calf Strength / Balance)    Stand with support, _0__ lb weights on ankles. Breathe in. Rise up on tiptoes, breathing out through pursed lips. Hold position to count of _2__. Return slowly, breathing in. Repeat _10__ times per session. Do_2__ sessions per day. Variation: Do without weights.  Copyright  VHI. All rights reserved.  Sit to Stand / Stand to Sit / Transfers    Sit on edge of a solid chair with arms, feet flat on floor. Lean forward over feet and stand up with hands on chair arms. Sit down slowly with hands on chair arms. Repeat _10___ times per session. Do __2__ sessions per day.  Copyright  VHI. All rights reserved.

## 2015-11-29 ENCOUNTER — Encounter: Payer: Self-pay | Admitting: Physical Therapy

## 2015-11-29 ENCOUNTER — Ambulatory Visit: Payer: Medicare Other | Admitting: Physical Therapy

## 2015-11-29 DIAGNOSIS — R262 Difficulty in walking, not elsewhere classified: Secondary | ICD-10-CM

## 2015-11-29 DIAGNOSIS — IMO0002 Reserved for concepts with insufficient information to code with codable children: Secondary | ICD-10-CM

## 2015-11-29 NOTE — Therapy (Signed)
[place] [facility] MAIN [facility] [place], [place], [address] Phone: [phone]   Fax:  [phone]  Physical Therapy Treatment  Patient Details  Name: Wendy [name] MRN: [id number] Date of Birth: 05/14/1946 Referring Provider: Wyvonne Lenz  Encounter Date: 11/29/2015      PT End of Session - 11/29/15 1402    Visit Number 3   Number of Visits 17   Date for PT Re-Evaluation 02/14/16   Authorization Type g codes2   Authorization Time Period 10   PT Start Time 0145   PT Stop Time 0230   PT Time Calculation (min) 45 min   Equipment Utilized During Treatment Gait belt   Activity Tolerance Patient tolerated treatment well;Patient limited by fatigue   Behavior During Therapy [facility] for tasks assessed/performed      History reviewed. No pertinent past medical history.  History reviewed. No pertinent past surgical history.  There were no vitals filed for this visit.  Visit Diagnosis:  Difficulty walking  Weakness due to cerebrovascular accident      Subjective Assessment - 11/29/15 1401    Subjective Patient reports doing okay today; she denies any pain;    Patient is accompained by: Family member   Pertinent History DM, CVA, head aches, UTI, SOB, memory impaired, left knee surgery, heart murmur   Currently in Pain? No/denies         Gait training with Rw and 100 feet x 2, gait training in parallel bars over 2 1/2 foam stepping 60 feet and SBA.  Patient needs cuing for safety and posture.  Standing stepping activities fwd/bwd stepping x 10 Mod  cueing needed to appropriately perform gait  tasks with leg, hand, and head position. Decreased coordination demonstrated requiring consistent verbal cueing to correct form. Cognitive understanding of task was delayed. Patient continues to demonstrate some in coordination of movement with select exercises such as stepping backwards. Patient responds well to verbal and tactile cues  to correct form and technique.  CGA to SBA for safety with activities.  Uses to increase intensity and amplitude of movements throughout session                        PT Education - 11/29/15 1401    Education provided Yes   Education Details HEP   Person(s) Educated Patient   Methods Explanation   Comprehension Verbalized understanding             PT Long Term Goals - 11/21/15 0925    PT LONG TERM GOAL #1   Title Patient will be independent in home exercise program to improve strength/mobility for better functional independence with ADLs   Baseline no HEP   Time 8   Period Weeks   Status New   PT LONG TERM GOAL #2   Title Patient (> 4 years old) will complete five times sit to stand test in < 15 seconds indicating an increased LE strength and improved balance   Baseline 55.19 sec   Time 8   Status New   PT LONG TERM GOAL #3   Title . Patient will increase 10 meter walk test to >1.21m/s as to improve gait speed for better community ambulation and to reduce fall risk   Baseline . 3 m/sec   Time 8   Period Weeks   Status New   PT LONG TERM GOAL #4   Title Patient will be independent with bed mobility sit to  supine and supien to sit.   Baseline min assist for LE    Time 8   Period Weeks   Status New               Plan - 11/29/15 1402    Clinical Impression Statement Patient needs mod cues to perform sit to stand and to continue with task being performed. She has slow movements with ambulation and all mobility.    Pt will benefit from skilled therapeutic intervention in order to improve on the following deficits Abnormal gait;Decreased cognition;Decreased coordination;Decreased mobility;Decreased activity tolerance;Decreased endurance;Decreased strength;Decreased balance;Decreased safety awareness;Difficulty walking;Obesity   Rehab Potential Fair   PT Frequency 2x / week   PT Duration 8 weeks   PT Treatment/Interventions Balance  training;Therapeutic exercise;Therapeutic activities;Functional mobility training;Gait training;Neuromuscular re-education        Problem List Patient Active Problem List   Diagnosis Date Noted  . DIABETES MELLITUS, TYPE II 01/18/2011  . LABYRINTHITIS, ACUTE 01/18/2011  . HYPERTENSION 01/18/2011   Ezekiel Ina, PT, DPT [place], Barkley Bruns S 11/29/2015, 2:04 PM  [facility] MAIN [facility] SERVICES [address] [place], [place], [address] Phone: [phone]   Fax:  [phone]  Name: KYNZLI REASE MRN: [id number] Date of Birth: [birth year]

## 2015-12-01 ENCOUNTER — Ambulatory Visit: Payer: Medicare Other | Admitting: Physical Therapy

## 2015-12-01 ENCOUNTER — Encounter: Payer: Self-pay | Admitting: Physical Therapy

## 2015-12-01 DIAGNOSIS — IMO0002 Reserved for concepts with insufficient information to code with codable children: Secondary | ICD-10-CM

## 2015-12-01 DIAGNOSIS — R262 Difficulty in walking, not elsewhere classified: Secondary | ICD-10-CM

## 2015-12-01 NOTE — Therapy (Signed)
[place] [facility] MAIN [facility] SERVICES [address] [place], [place], [address] Phone: [phone]   Fax:  [phone]  Physical Therapy Treatment  Patient Details  Name: Wendy [name] MRN: [id number] Date of Birth: 1945-11-21 Referring Provider: Wyvonne Lenz  Encounter Date: 12/01/2015      PT End of Session - 12/01/15 1353    Visit Number 4   Number of Visits 17   Date for PT Re-Evaluation 2016/02/15   Authorization Type g codes2   Authorization Time Period 10   PT Start Time 0145   PT Stop Time 0230   PT Time Calculation (min) 45 min   Equipment Utilized During Treatment Gait belt   Activity Tolerance Patient tolerated treatment well;Patient limited by fatigue   Behavior During Therapy [facility] for tasks assessed/performed      History reviewed. No pertinent past medical history.  History reviewed. No pertinent past surgical history.  There were no vitals filed for this visit.  Visit Diagnosis:  Difficulty walking  Weakness due to cerebrovascular accident      Subjective Assessment - 12/01/15 1353    Subjective Patient reports doing okay today; she denies any pain;    Patient is accompained by: Family member   Pertinent History DM, CVA, head aches, UTI, SOB, memory impaired, left knee surgery, heart murmur      THER-EX Standing exercises with 2# ankle weights: Marching  x 10; SLR  x 10; Abduction x 10; Extension  x 10; Heel raises  x 10; Sit to stand with UE support 2 x 10; Step-ups to 6" step x 10 bilateral;  Mod verbal cues used throughout with increased in postural sway and LOB most seen with narrow base of support and while on uneven surfaces. Continues to have balance deficits typical with diagnosis. Patient performs intermediate level exercises with pain behaviors and needs verbal cuing for postural alignment and head positioning Tactile cues and assistance needed to keep lower leg and knee in neutral to avoid compensations  with ankle motions.                            PT Education - 12/01/15 1353    Education provided Yes   Education Details HEP   Person(s) Educated Patient   Methods Explanation   Comprehension Verbalized understanding             PT Long Term Goals - 11/21/15 0925    PT LONG TERM GOAL #1   Title Patient will be independent in home exercise program to improve strength/mobility for better functional independence with ADLs   Baseline no HEP   Time 8   Period Weeks   Status New   PT LONG TERM GOAL #2   Title Patient (> 49 years old) will complete five times sit to stand test in < 15 seconds indicating an increased LE strength and improved balance   Baseline 55.19 sec   Time 8   Status New   PT LONG TERM GOAL #3   Title . Patient will increase 10 meter walk test to >1.36m/s as to improve gait speed for better community ambulation and to reduce fall risk   Baseline . 3 m/sec   Time 8   Period Weeks   Status New   PT LONG TERM GOAL #4   Title Patient will be independent with bed mobility sit to supine and supien to sit.   Baseline min assist for  LE    Time 8   Period Weeks   Status New               Plan - 12/01/15 1354    Clinical Impression Statement Fatigue with sit to stand but demonstrating more control, Increase weight for standing exercises. Fatigue still evident with cross trainer and endurance   Pt will benefit from skilled therapeutic intervention in order to improve on the following deficits Abnormal gait;Decreased cognition;Decreased coordination;Decreased mobility;Decreased activity tolerance;Decreased endurance;Decreased strength;Decreased balance;Decreased safety awareness;Difficulty walking;Obesity   Rehab Potential Fair   PT Frequency 2x / week   PT Duration 8 weeks   PT Treatment/Interventions Balance training;Therapeutic exercise;Therapeutic activities;Functional mobility training;Gait training;Neuromuscular re-education         Problem List Patient Active Problem List   Diagnosis Date Noted  . DIABETES MELLITUS, TYPE II 01/18/2011  . LABYRINTHITIS, ACUTE 01/18/2011  . HYPERTENSION 01/18/2011    Jones Broom S 12/01/2015, 1:55 PM  [place] [facility] MAIN [facility] SERVICES [address] [place], [place], [address] Phone: [phone]   Fax:  [phone]  Name: ABBYGALE LAPID MRN: [id number] Date of Birth: 1946-02-22

## 2015-12-06 ENCOUNTER — Encounter: Payer: Self-pay | Admitting: Physical Therapy

## 2015-12-06 ENCOUNTER — Ambulatory Visit: Payer: Medicare Other | Admitting: Physical Therapy

## 2015-12-06 DIAGNOSIS — IMO0002 Reserved for concepts with insufficient information to code with codable children: Secondary | ICD-10-CM

## 2015-12-06 DIAGNOSIS — R262 Difficulty in walking, not elsewhere classified: Secondary | ICD-10-CM | POA: Diagnosis not present

## 2015-12-06 NOTE — Therapy (Signed)
[place] [facility] MAIN [facility] SERVICES [address] [place], [place], [address] Phone: [phone]   Fax:  [phone]  Physical Therapy Treatment  Patient Details  Name: Wendy [name] MRN: [id number] Date of Birth: Dec 13, 1945 Referring Provider: Wyvonne Lenz  Encounter Date: 12/06/2015      PT End of Session - 12/06/15 1353    Visit Number 5   Number of Visits 17   Date for PT Re-Evaluation 01-22-2016   Authorization Type g codes 5   Authorization Time Period 10   PT Start Time 1300   PT Stop Time 1345   PT Time Calculation (min) 45 min   Equipment Utilized During Treatment Gait belt   Activity Tolerance Patient tolerated treatment well;Patient limited by fatigue   Behavior During Therapy [facility] for tasks assessed/performed      History reviewed. No pertinent past medical history.  History reviewed. No pertinent past surgical history.  There were no vitals filed for this visit.  Visit Diagnosis:  Difficulty walking  Weakness due to cerebrovascular accident      Subjective Assessment - 12/06/15 1308    Subjective Patient reports doing some of the exercises at home but is not consistent. She also reports walking some at home trying to do more for herself.    Patient is accompained by: Family member   Pertinent History DM, CVA, head aches, UTI, SOB, memory impaired, left knee surgery, heart murmur   Currently in Pain? No/denies      TREATMENT: Warm up on Nustep BUE/BLE level 2 x4 min (unbilled);  Gait with RW, 100 feet x1, 125 feet x1 with mod VCs to increase step length, increase DF at heel strike for increased foot clearance, increase erect posture and to stay closer to RW; patient continues to ambulate slowly and far away from RW requiring min A for balance;  Seated with 2# ankle weights: Alternate LAQ x10 bilaterally with tactile cues to increase knee extension for increased quad strengthening; Sit<>Stand while holding pillow in  BUE x5 with min-mod A for transfer ability;  Standing with 2# ankle weight: Alternate march x10 bilaterally Hip abduction x15 bilaterally; Hamstring curl knee flexion x15 bilaterally with mod VCs to increase ROM for strengthening.  Patient very slow with exercise and requires increased cues to increase ROM and increase erect posture with all exercise. Instructed patient and caregiver that she needs to do more exercises at home.                           PT Education - 12/06/15 1353    Education provided Yes   Education Details strengthening   Person(s) Educated Patient   Methods Explanation;Verbal cues   Comprehension Verbalized understanding;Returned demonstration;Verbal cues required             PT Long Term Goals - 11/21/15 0925    PT LONG TERM GOAL #1   Title Patient will be independent in home exercise program to improve strength/mobility for better functional independence with ADLs   Baseline no HEP   Time 8   Period Weeks   Status New   PT LONG TERM GOAL #2   Title Patient (> 47 years old) will complete five times sit to stand test in < 15 seconds indicating an increased LE strength and improved balance   Baseline 55.19 sec   Time 8   Status New   PT LONG TERM GOAL #3   Title . Patient  will increase 10 meter walk test to >1.33m/s as to improve gait speed for better community ambulation and to reduce fall risk   Baseline . 3 m/sec   Time 8   Period Weeks   Status New   PT LONG TERM GOAL #4   Title Patient will be independent with bed mobility sit to supine and supien to sit.   Baseline min assist for LE    Time 8   Period Weeks   Status New               Plan - 12/06/15 1356    Clinical Impression Statement Instructed patient in BLE strengthening exercise. Patient continues to fatigue often. She requires increased verbal and tactile cues to increase ROM for better strengthening. Patient would benefit from additional skilled PT  Intervention to improve LE strength, balance and gait safety;    Pt will benefit from skilled therapeutic intervention in order to improve on the following deficits Abnormal gait;Decreased cognition;Decreased coordination;Decreased mobility;Decreased activity tolerance;Decreased endurance;Decreased strength;Decreased balance;Decreased safety awareness;Difficulty walking;Obesity   Rehab Potential Fair   PT Frequency 2x / week   PT Duration 8 weeks   PT Treatment/Interventions Balance training;Therapeutic exercise;Therapeutic activities;Functional mobility training;Gait training;Neuromuscular re-education        Problem List Patient Active Problem List   Diagnosis Date Noted  . DIABETES MELLITUS, TYPE II 01/18/2011  . LABYRINTHITIS, ACUTE 01/18/2011  . HYPERTENSION 01/18/2011    Trotter,Margaret PT, DPT 12/06/2015, 2:08 PM  [place] [facility] MAIN [facility] SERVICES [address] [place], [place], [address] Phone: [phone]   Fax:  [phone]  Name: Wendy [name] MRN: [id number] Date of Birth: 04/14/1946

## 2015-12-08 ENCOUNTER — Encounter: Payer: Self-pay | Admitting: Physical Therapy

## 2015-12-08 ENCOUNTER — Ambulatory Visit: Payer: Medicare Other | Attending: Family Medicine | Admitting: Physical Therapy

## 2015-12-08 DIAGNOSIS — I69898 Other sequelae of other cerebrovascular disease: Secondary | ICD-10-CM | POA: Diagnosis present

## 2015-12-08 DIAGNOSIS — R531 Weakness: Secondary | ICD-10-CM | POA: Insufficient documentation

## 2015-12-08 DIAGNOSIS — R262 Difficulty in walking, not elsewhere classified: Secondary | ICD-10-CM | POA: Insufficient documentation

## 2015-12-08 DIAGNOSIS — IMO0002 Reserved for concepts with insufficient information to code with codable children: Secondary | ICD-10-CM

## 2015-12-08 NOTE — Therapy (Signed)
[place] [facility] MAIN [facility] SERVICES [address] [place], [place], [address] Phone: [phone]   Fax:  [phone]  Physical Therapy Treatment  Patient Details  Name: Wendy [name] MRN: [id number] Date of Birth: 1946/04/17 Referring Provider: Wyvonne Lenz  Encounter Date: 12/08/2015      PT End of Session - 12/08/15 0959    Visit Number 6   Number of Visits 17   Date for PT Re-Evaluation 01-27-2016   Authorization Type g codes 6   Authorization Time Period 10   PT Start Time 0948   PT Stop Time 1030   PT Time Calculation (min) 42 min   Equipment Utilized During Treatment Gait belt   Activity Tolerance Patient tolerated treatment well;Patient limited by fatigue   Behavior During Therapy [facility] for tasks assessed/performed      History reviewed. No pertinent past medical history.  History reviewed. No pertinent past surgical history.  There were no vitals filed for this visit.  Visit Diagnosis:  Difficulty walking  Weakness due to cerebrovascular accident      Subjective Assessment - 12/08/15 0950    Subjective Patient reports feeling a "little better"; She denies any new falls;    Patient is accompained by: Family member   Pertinent History DM, CVA, head aches, UTI, SOB, memory impaired, left knee surgery, heart murmur   Currently in Pain? No/denies        TREATMENT: Warm up on Nustep BUE/BLE level 2 x5 min with cues to increase steps per minute to >40; Patient had increased difficulty and continues to move machine at 30 steps per minute average;  Gait with RW,  125 feet x1 with mod VCs to increase step length, increase DF at heel strike for increased foot clearance, increase erect posture and to stay closer to RW; patient continues to ambulate slowly and far away from RW requiring min A for balance; She required increased cues to keep walker close to self with turns;  BLE leg press plate 69# [id number]; Patient required min VCs to slow  down eccentric return and to increase knee extension with exercise for increased strengthening;  Standing in parellel bars: Side stepping with red tband around ankles 10 feet x2 laps each direction; Alternate march x15 bilaterally; Heel/toe raises x15 bilaterally;  Patient required min-moderate verbal/tactile cues for correct exercise technique.                           PT Education - 12/08/15 0959    Education provided Yes   Education Details strengthening,   Person(s) Educated Patient   Methods Explanation;Verbal cues   Comprehension Verbalized understanding;Returned demonstration;Verbal cues required             PT Long Term Goals - 11/21/15 0925    PT LONG TERM GOAL #1   Title Patient will be independent in home exercise program to improve strength/mobility for better functional independence with ADLs   Baseline no HEP   Time 8   Period Weeks   Status New   PT LONG TERM GOAL #2   Title Patient (> 11 years old) will complete five times sit to stand test in < 15 seconds indicating an increased LE strength and improved balance   Baseline 55.19 sec   Time 8   Status New   PT LONG TERM GOAL #3   Title . Patient will increase 10 meter walk test to >1.24m/s as to improve gait speed  for better community ambulation and to reduce fall risk   Baseline . 3 m/sec   Time 8   Period Weeks   Status New   PT LONG TERM GOAL #4   Title Patient will be independent with bed mobility sit to supine and supien to sit.   Baseline min assist for LE    Time 8   Period Weeks   Status New               Plan - 12/08/15 1056    Clinical Impression Statement Instructed patient in BLE strengthening exercise. Patient continues to require frequent verbal cues to increase ROM and increase speed of movement for increased strengthening. Patient requires frequent rest breaks and is limited in standing tolerance. She would benefit from additional skilled PT Intervention  to improve LE strength, balance and gait safety;    Pt will benefit from skilled therapeutic intervention in order to improve on the following deficits Abnormal gait;Decreased cognition;Decreased coordination;Decreased mobility;Decreased activity tolerance;Decreased endurance;Decreased strength;Decreased balance;Decreased safety awareness;Difficulty walking;Obesity   Rehab Potential Fair   PT Frequency 2x / week   PT Duration 8 weeks   PT Treatment/Interventions Balance training;Therapeutic exercise;Therapeutic activities;Functional mobility training;Gait training;Neuromuscular re-education   PT Next Visit Plan work on strengthening, standing tolerance   PT Home Exercise Plan continue as given;         Problem List Patient Active Problem List   Diagnosis Date Noted  . DIABETES MELLITUS, TYPE II 01/18/2011  . LABYRINTHITIS, ACUTE 01/18/2011  . HYPERTENSION 01/18/2011    Miyoko Hashimi PT, DPT 12/08/2015, 10:58 AM  [place] [facility] MAIN [facility] SERVICES [address] [place], [place], [address] Phone: [phone]   Fax:  [phone]  Name: Wendy [name] MRN: [id number] Date of Birth: [birth year]

## 2015-12-13 ENCOUNTER — Ambulatory Visit: Payer: Medicare Other | Admitting: Physical Therapy

## 2015-12-13 ENCOUNTER — Encounter: Payer: Self-pay | Admitting: Physical Therapy

## 2015-12-13 DIAGNOSIS — R262 Difficulty in walking, not elsewhere classified: Secondary | ICD-10-CM

## 2015-12-13 DIAGNOSIS — IMO0002 Reserved for concepts with insufficient information to code with codable children: Secondary | ICD-10-CM

## 2015-12-13 NOTE — Therapy (Signed)
[facility] MAIN [facility] SERVICES [address] [place], [place], [address] Phone: [phone]   Fax:  [phone]  Physical Therapy Treatment  Patient Details  Name: Wendy [name] MRN: [id number] Date of Birth: 08-14-1946 Referring Provider: Wyvonne Lenz  Encounter Date: 12/13/2015      PT End of Session - 12/13/15 1041    Visit Number 7   Number of Visits 17   Date for PT Re-Evaluation February 14, 2016   Authorization Type g codes 7   Authorization Time Period 10   PT Start Time 1033   PT Stop Time 1100   PT Time Calculation (min) 27 min   Equipment Utilized During Treatment Gait belt   Activity Tolerance Patient tolerated treatment well;Patient limited by fatigue   Behavior During Therapy [facility] for tasks assessed/performed      History reviewed. No pertinent past medical history.  History reviewed. No pertinent past surgical history.  There were no vitals filed for this visit.  Visit Diagnosis:  Difficulty walking  Weakness due to cerebrovascular accident      Subjective Assessment - 12/13/15 1036    Subjective Patient was late to PT; She reports "I am sorry that I'm late." She reports doing some of her exercises at home;    Pertinent History DM, CVA, head aches, UTI, SOB, memory impaired, left knee surgery, heart murmur   Currently in Pain? No/denies        TREATMENT: Warm up on Nustep BUE/BLE level 2 x4 min with cues to increase steps per minute to >40; Patient had increased difficulty and continues to move machine at 30 steps per minute average;  Gait with RW, 50 feet x2 with mod VCs to increase step length, increase DF at heel strike for increased foot clearance, increase erect posture and to stay closer to RW; patient continues to ambulate slowly and far away from RW requiring CGA/close supervision for balance; She required increased cues to keep walker close to self with turns;  BLE leg press plate 32# [id number]; Patient required  min VCs to return to starting position for increased knee flexion and strengthening;  Sit<>Stand from regular chair with airex without HHA x5;  Patient required min-moderate verbal/tactile cues for correct exercise technique.                           PT Education - 12/13/15 1041    Education provided Yes   Education Details strengthening   Person(s) Educated Patient   Methods Explanation;Verbal cues   Comprehension Verbalized understanding;Returned demonstration;Verbal cues required             PT Long Term Goals - 11/21/15 0925    PT LONG TERM GOAL #1   Title Patient will be independent in home exercise program to improve strength/mobility for better functional independence with ADLs   Baseline no HEP   Time 8   Period Weeks   Status New   PT LONG TERM GOAL #2   Title Patient (> 68 years old) will complete five times sit to stand test in < 15 seconds indicating an increased LE strength and improved balance   Baseline 55.19 sec   Time 8   Status New   PT LONG TERM GOAL #3   Title . Patient will increase 10 meter walk test to >1.49m/s as to improve gait speed for better community ambulation and to reduce fall risk   Baseline . 3 m/sec   Time  8   Period Weeks   Status New   PT LONG TERM GOAL #4   Title Patient will be independent with bed mobility sit to supine and supien to sit.   Baseline min assist for LE    Time 8   Period Weeks   Status New               Plan - 12/13/15 1042    Clinical Impression Statement Instructed patient in BLE strengthening exercise. She was late to PT appointment and our session was limited; Patient requires min VCs for correct exercise technique. She continues to move slowly and require increased rest breaks. She also requires cues for motivation to continue with exercise. She would benefit from additional skilled PT Intervention to improve strength and gait safety;    Pt will benefit from skilled therapeutic  intervention in order to improve on the following deficits Abnormal gait;Decreased cognition;Decreased coordination;Decreased mobility;Decreased activity tolerance;Decreased endurance;Decreased strength;Decreased balance;Decreased safety awareness;Difficulty walking;Obesity   Rehab Potential Fair   PT Frequency 2x / week   PT Duration 8 weeks   PT Treatment/Interventions Balance training;Therapeutic exercise;Therapeutic activities;Functional mobility training;Gait training;Neuromuscular re-education   PT Next Visit Plan work on strengthening, standing tolerance, balance   PT Home Exercise Plan continue as given;         Problem List Patient Active Problem List   Diagnosis Date Noted  . DIABETES MELLITUS, TYPE II 01/18/2011  . LABYRINTHITIS, ACUTE 01/18/2011  . HYPERTENSION 01/18/2011    Marcell Chavarin PT, DPT 12/13/2015, 1:17 PM  [place] [facility] MAIN [facility] SERVICES [address] [place], [place], [address] Phone: [phone]   Fax:  [phone]  Name: DANNON PERLOW MRN: [id number] Date of Birth: 1946-09-06

## 2015-12-15 ENCOUNTER — Ambulatory Visit: Payer: Medicare Other

## 2015-12-15 DIAGNOSIS — R262 Difficulty in walking, not elsewhere classified: Secondary | ICD-10-CM

## 2015-12-15 DIAGNOSIS — IMO0002 Reserved for concepts with insufficient information to code with codable children: Secondary | ICD-10-CM

## 2015-12-15 NOTE — Therapy (Signed)
[place] MAIN [facility] SERVICES [address] [place], [place], [address] Phone: [phone]   Fax:  [phone]  Physical Therapy Treatment / Discharge note  Patient Details  Name: Wendy [name] MRN: [id number] Date of Birth: July 06, 1946 Referring Provider: Elvera Bicker  Encounter Date: 12/15/2015      PT End of Session - 12/15/15 1313    Visit Number 8   Number of Visits 17   Date for PT Re-Evaluation 01/16/16   Authorization Type 8   Authorization Time Period 10   PT Start Time 1020   PT Stop Time 1100   PT Time Calculation (min) 40 min   Equipment Utilized During Treatment Gait belt   Activity Tolerance Patient tolerated treatment well;Patient limited by fatigue   Behavior During Therapy [facility] for tasks assessed/performed      No past medical history on file.  No past surgical history on file.  There were no vitals filed for this visit.  Visit Diagnosis:  Difficulty walking  Weakness due to cerebrovascular accident      Subjective Assessment - 12/15/15 1312    Subjective "I tried to move faster, but I cant". "I do some of the exercises at home"   Patient is accompained by: Family member   Pertinent History DM, CVA, head aches, UTI, SOB, memory impaired, left knee surgery, heart murmur   Currently in Pain? No/denies       Therex: PT reassessed outcome measures and progress towards goals as follows: 5x sit to stand: 75s 31mwalk: 0.349m Tug: 95s Pt has made no significant functional progress.  gait assessment: Pt ambulates with RW: needing min cues to keep RW closer to her feet, she has equal step length, very slow cadence and movement, equally low foot clearance Pt asks for extended restbreaks and water.                          PT Education - 12/15/15 1312    Education provided Yes   Education Details lack of progress towards goals, importance of HEP to maintain mobility, Discharge plan and  reason for DC   Person(s) Educated Patient;Spouse   Methods Explanation   Comprehension Verbalized understanding             PT Long Term Goals - 12/15/15 1314    PT LONG TERM GOAL #1   Title Patient will be independent in home exercise program to improve strength/mobility for better functional independence with ADLs   Baseline no HEP   Time 8   Period Weeks   Status Partially Met   PT LONG TERM GOAL #2   Title Patient (> 6061ears old) will complete five times sit to stand test in < 15 seconds indicating an increased LE strength and improved balance   Baseline 55.19 sec   Time 8   Status Not Met   PT LONG TERM GOAL #3   Title . Patient will increase 10 meter walk test to >1.42m82mas to improve gait speed for better community ambulation and to reduce fall risk   Baseline . 3 m/sec   Time 8   Period Weeks   Status Not Met   PT LONG TERM GOAL #4   Title Patient will be independent with bed mobility sit to supine and supien to sit.   Baseline min assist for LE    Time 8   Period Weeks   Status Not Met  Plan - 04-Jan-2016 1313    Clinical Impression Statement PT reassessed outcome measures and progress towards goals today. pt has not made progress in the last 4 weeks of PT and is likely at her long term baseline. pt has been partially compliant to HEP. pt and husband encouraged to keep up with HEP after DC to maintain her function. pt and husband verbally acknowledge reason for Dc.    Pt will benefit from skilled therapeutic intervention in order to improve on the following deficits Abnormal gait;Decreased cognition;Decreased coordination;Decreased mobility;Decreased activity tolerance;Decreased endurance;Decreased strength;Decreased balance;Decreased safety awareness;Difficulty walking;Obesity   Rehab Potential Fair   PT Frequency 2x / week   PT Duration 8 weeks   PT Treatment/Interventions Balance training;Therapeutic exercise;Therapeutic  activities;Functional mobility training;Gait training;Neuromuscular re-education          G-Codes - Jan 04, 2016 1315    Functional Assessment Tool Used TUG, 1mwalk, 5x sit to stand   Functional Limitation Mobility: Walking and moving around   Mobility: Walking and Moving Around Current Status [phone] At least 60 percent but less than 80 percent impaired, limited or restricted   Mobility: Walking and Moving Around Goal Status [phone] At least 60 percent but less than 80 percent impaired, limited or restricted   Mobility: Walking and Moving Around Discharge Status [phone] At least 60 percent but less than 80 percent impaired, limited or restricted      Problem List Patient Active Problem List   Diagnosis Date Noted  . DIABETES MELLITUS, TYPE II 01/18/2011  . LABYRINTHITIS, ACUTE 01/18/2011  . HYPERTENSION 01/18/2011   AGorden Harms Eben Choinski, PT, DPT #[id number] Shaquill Iseman 22017/03/01 1:17 PM  [place] [facility] [place] [address] [address]Phone: [phone]  Fax:  [phone] Name: MENYLA LISBONMRN: [id number] of Birth: [birth year]

## 2015-12-20 ENCOUNTER — Ambulatory Visit: Payer: Medicare Other

## 2015-12-22 ENCOUNTER — Ambulatory Visit: Payer: Medicare Other

## 2015-12-27 ENCOUNTER — Ambulatory Visit: Payer: Medicare Other

## 2015-12-29 ENCOUNTER — Ambulatory Visit: Payer: Medicare Other

## 2016-01-27 ENCOUNTER — Other Ambulatory Visit
Admission: RE | Admit: 2016-01-27 | Discharge: 2016-01-27 | Disposition: A | Discharge status: Home / Self Care | Payer: Medicare Other | Source: Ambulatory Visit | Attending: Family Medicine | Admitting: Family Medicine

## 2016-01-27 DIAGNOSIS — I10 Essential (primary) hypertension: Secondary | ICD-10-CM | POA: Insufficient documentation

## 2016-01-27 LAB — BASIC METABOLIC PANEL
ANION GAP: 6 (ref 5–15)
BUN: 17 mg/dL (ref 6–20)
CALCIUM: 9.1 mg/dL (ref 8.9–10.3)
CO2: 26 mmol/L (ref 22–32)
Chloride: 104 mmol/L (ref 101–111)
Creatinine, Ser: 1.23 mg/dL — ABNORMAL HIGH (ref 0.44–1.00)
GFR calc Af Amer: 51 mL/min — ABNORMAL LOW (ref >60)
GFR calc non Af Amer: 44 mL/min — ABNORMAL LOW (ref >60)
GLUCOSE: 267 mg/dL — AB (ref 65–99)
Potassium: 4.9 mmol/L (ref 3.5–5.1)
Sodium: 136 mmol/L (ref 135–145)

## 2017-04-22 ENCOUNTER — Other Ambulatory Visit: Payer: Self-pay | Admitting: Family Medicine

## 2017-04-22 DIAGNOSIS — Z1231 Encounter for screening mammogram for malignant neoplasm of breast: Secondary | ICD-10-CM

## 2017-05-10 ENCOUNTER — Encounter: Payer: Self-pay | Admitting: Radiology

## 2017-05-10 ENCOUNTER — Ambulatory Visit
Admission: RE | Admit: 2017-05-10 | Discharge: 2017-05-10 | Disposition: A | Discharge status: Home / Self Care | Payer: No Typology Code available for payment source | Source: Ambulatory Visit | Attending: Family Medicine | Admitting: Family Medicine

## 2017-05-10 DIAGNOSIS — Z1231 Encounter for screening mammogram for malignant neoplasm of breast: Secondary | ICD-10-CM | POA: Insufficient documentation

## 2017-05-14 ENCOUNTER — Other Ambulatory Visit
Admission: RE | Admit: 2017-05-14 | Discharge: 2017-05-14 | Disposition: A | Discharge status: Home / Self Care | Payer: No Typology Code available for payment source | Source: Ambulatory Visit | Attending: Family Medicine | Admitting: Family Medicine

## 2017-05-14 ENCOUNTER — Other Ambulatory Visit: Payer: Self-pay | Admitting: Family Medicine

## 2017-05-14 DIAGNOSIS — D649 Anemia, unspecified: Secondary | ICD-10-CM | POA: Insufficient documentation

## 2017-05-14 DIAGNOSIS — E109 Type 1 diabetes mellitus without complications: Secondary | ICD-10-CM | POA: Diagnosis present

## 2017-05-14 DIAGNOSIS — I10 Essential (primary) hypertension: Secondary | ICD-10-CM | POA: Insufficient documentation

## 2017-05-14 DIAGNOSIS — R928 Other abnormal and inconclusive findings on diagnostic imaging of breast: Secondary | ICD-10-CM

## 2017-05-14 DIAGNOSIS — R921 Mammographic calcification found on diagnostic imaging of breast: Secondary | ICD-10-CM

## 2017-05-14 LAB — COMPREHENSIVE METABOLIC PANEL
ALT: 58 U/L — ABNORMAL HIGH (ref 14–54)
AST: 60 U/L — ABNORMAL HIGH (ref 15–41)
Albumin: 3 g/dL — ABNORMAL LOW (ref 3.5–5.0)
Alkaline Phosphatase: 60 U/L (ref 38–126)
Anion gap: 5 (ref 5–15)
BILIRUBIN TOTAL: 0.4 mg/dL (ref 0.3–1.2)
BUN: 14 mg/dL (ref 6–20)
CHLORIDE: 105 mmol/L (ref 101–111)
CO2: 28 mmol/L (ref 22–32)
CREATININE: 0.96 mg/dL (ref 0.44–1.00)
Calcium: 9.3 mg/dL (ref 8.9–10.3)
GFR, EST NON AFRICAN AMERICAN: 59 mL/min — AB (ref >60)
Glucose, Bld: 203 mg/dL — ABNORMAL HIGH (ref 65–99)
Potassium: 4.1 mmol/L (ref 3.5–5.1)
Sodium: 138 mmol/L (ref 135–145)
TOTAL PROTEIN: 6.4 g/dL — AB (ref 6.5–8.1)

## 2017-05-14 LAB — MAGNESIUM: MAGNESIUM: 1.7 mg/dL (ref 1.7–2.4)

## 2017-05-14 LAB — CBC WITH DIFFERENTIAL/PLATELET
Basophils Absolute: 0 10*3/uL (ref 0–0.1)
Basophils Relative: 0 %
EOS PCT: 0 %
Eosinophils Absolute: 0 10*3/uL (ref 0–0.7)
HEMATOCRIT: 43.8 % (ref 35.0–47.0)
Hemoglobin: 13.7 g/dL (ref 12.0–16.0)
LYMPHS ABS: 2.1 10*3/uL (ref 1.0–3.6)
LYMPHS PCT: 43 %
MCH: 21.7 pg — AB (ref 26.0–34.0)
MCHC: 31.3 g/dL — ABNORMAL LOW (ref 32.0–36.0)
MCV: 69.4 fL — AB (ref 80.0–100.0)
MONO ABS: 0.6 10*3/uL (ref 0.2–0.9)
MONOS PCT: 11 %
Neutro Abs: 2.3 10*3/uL (ref 1.4–6.5)
Neutrophils Relative %: 46 %
Platelets: 134 10*3/uL — ABNORMAL LOW (ref 150–440)
RBC: 6.31 MIL/uL — ABNORMAL HIGH (ref 3.80–5.20)
RDW: 14.7 % — AB (ref 11.5–14.5)
WBC: 5 10*3/uL (ref 3.6–11.0)

## 2017-05-22 ENCOUNTER — Ambulatory Visit
Admission: RE | Admit: 2017-05-22 | Discharge: 2017-05-22 | Disposition: A | Discharge status: Home / Self Care | Payer: No Typology Code available for payment source | Source: Ambulatory Visit | Attending: Family Medicine | Admitting: Family Medicine

## 2017-05-22 DIAGNOSIS — R921 Mammographic calcification found on diagnostic imaging of breast: Secondary | ICD-10-CM

## 2017-05-22 DIAGNOSIS — R928 Other abnormal and inconclusive findings on diagnostic imaging of breast: Secondary | ICD-10-CM | POA: Diagnosis present

## 2017-06-04 ENCOUNTER — Other Ambulatory Visit: Payer: Self-pay | Admitting: Family Medicine

## 2017-06-04 DIAGNOSIS — R928 Other abnormal and inconclusive findings on diagnostic imaging of breast: Secondary | ICD-10-CM

## 2017-12-28 ENCOUNTER — Inpatient Hospital Stay
Admission: EM | Admit: 2017-12-28 | Discharge: 2017-12-30 | DRG: 689 | Disposition: A | Discharge status: Home / Self Care | Payer: Medicare Other | Attending: Internal Medicine | Admitting: Internal Medicine

## 2017-12-28 ENCOUNTER — Emergency Department: Payer: Medicare Other

## 2017-12-28 ENCOUNTER — Other Ambulatory Visit: Payer: Self-pay

## 2017-12-28 ENCOUNTER — Encounter: Payer: Self-pay | Admitting: Emergency Medicine

## 2017-12-28 ENCOUNTER — Inpatient Hospital Stay: Payer: Medicare Other

## 2017-12-28 DIAGNOSIS — G9341 Metabolic encephalopathy: Secondary | ICD-10-CM | POA: Diagnosis present

## 2017-12-28 DIAGNOSIS — I10 Essential (primary) hypertension: Secondary | ICD-10-CM | POA: Diagnosis present

## 2017-12-28 DIAGNOSIS — Z7901 Long term (current) use of anticoagulants: Secondary | ICD-10-CM

## 2017-12-28 DIAGNOSIS — N390 Urinary tract infection, site not specified: Principal | ICD-10-CM | POA: Diagnosis present

## 2017-12-28 DIAGNOSIS — Z794 Long term (current) use of insulin: Secondary | ICD-10-CM

## 2017-12-28 DIAGNOSIS — G40909 Epilepsy, unspecified, not intractable, without status epilepticus: Secondary | ICD-10-CM | POA: Diagnosis present

## 2017-12-28 DIAGNOSIS — Z886 Allergy status to analgesic agent status: Secondary | ICD-10-CM

## 2017-12-28 DIAGNOSIS — K219 Gastro-esophageal reflux disease without esophagitis: Secondary | ICD-10-CM | POA: Diagnosis present

## 2017-12-28 DIAGNOSIS — W19XXXA Unspecified fall, initial encounter: Secondary | ICD-10-CM | POA: Diagnosis present

## 2017-12-28 DIAGNOSIS — E875 Hyperkalemia: Secondary | ICD-10-CM | POA: Diagnosis present

## 2017-12-28 DIAGNOSIS — G309 Alzheimer's disease, unspecified: Secondary | ICD-10-CM | POA: Diagnosis present

## 2017-12-28 DIAGNOSIS — E1151 Type 2 diabetes mellitus with diabetic peripheral angiopathy without gangrene: Secondary | ICD-10-CM | POA: Diagnosis present

## 2017-12-28 DIAGNOSIS — F0280 Dementia in other diseases classified elsewhere without behavioral disturbance: Secondary | ICD-10-CM | POA: Diagnosis present

## 2017-12-28 DIAGNOSIS — Z79899 Other long term (current) drug therapy: Secondary | ICD-10-CM | POA: Diagnosis not present

## 2017-12-28 DIAGNOSIS — E785 Hyperlipidemia, unspecified: Secondary | ICD-10-CM | POA: Diagnosis present

## 2017-12-28 DIAGNOSIS — Z90710 Acquired absence of both cervix and uterus: Secondary | ICD-10-CM

## 2017-12-28 DIAGNOSIS — Z833 Family history of diabetes mellitus: Secondary | ICD-10-CM | POA: Diagnosis not present

## 2017-12-28 DIAGNOSIS — R4182 Altered mental status, unspecified: Secondary | ICD-10-CM

## 2017-12-28 DIAGNOSIS — I69354 Hemiplegia and hemiparesis following cerebral infarction affecting left non-dominant side: Secondary | ICD-10-CM

## 2017-12-28 HISTORY — DX: Dementia in other diseases classified elsewhere, unspecified severity, without behavioral disturbance, psychotic disturbance, mood disturbance, and anxiety: F02.80

## 2017-12-28 HISTORY — DX: Epilepsy, unspecified, not intractable, without status epilepticus: G40.909

## 2017-12-28 HISTORY — DX: Cerebral infarction, unspecified: I63.9

## 2017-12-28 HISTORY — DX: Major depressive disorder, single episode, unspecified: F32.9

## 2017-12-28 HISTORY — DX: Alzheimer's disease, unspecified: G30.9

## 2017-12-28 HISTORY — DX: Retention of urine, unspecified: R33.9

## 2017-12-28 HISTORY — DX: Type 2 diabetes mellitus without complications: E11.9

## 2017-12-28 HISTORY — DX: Gastro-esophageal reflux disease without esophagitis: K21.9

## 2017-12-28 HISTORY — DX: Hyperlipidemia, unspecified: E78.5

## 2017-12-28 HISTORY — DX: Depression, unspecified: F32.A

## 2017-12-28 HISTORY — DX: Essential (primary) hypertension: I10

## 2017-12-28 HISTORY — DX: Urinary tract infection, site not specified: N39.0

## 2017-12-28 LAB — COMPREHENSIVE METABOLIC PANEL
ALT: 59 U/L — ABNORMAL HIGH (ref 14–54)
AST: 50 U/L — ABNORMAL HIGH (ref 15–41)
Albumin: 3.3 g/dL — ABNORMAL LOW (ref 3.5–5.0)
Alkaline Phosphatase: 65 U/L (ref 38–126)
Anion gap: 7 (ref 5–15)
BUN: 14 mg/dL (ref 6–20)
CO2: 28 mmol/L (ref 22–32)
Calcium: 9.2 mg/dL (ref 8.9–10.3)
Chloride: 105 mmol/L (ref 101–111)
Creatinine, Ser: 1.03 mg/dL — ABNORMAL HIGH (ref 0.44–1.00)
GFR calc Af Amer: >60 mL/min (ref >60)
GFR calc non Af Amer: 54 mL/min — ABNORMAL LOW (ref >60)
Glucose, Bld: 93 mg/dL (ref 65–99)
Potassium: 5.4 mmol/L — ABNORMAL HIGH (ref 3.5–5.1)
Sodium: 140 mmol/L (ref 135–145)
Total Bilirubin: 0.5 mg/dL (ref 0.3–1.2)
Total Protein: 7.3 g/dL (ref 6.5–8.1)

## 2017-12-28 LAB — URINALYSIS, COMPLETE (UACMP) WITH MICROSCOPIC
BILIRUBIN URINE: NEGATIVE
Glucose, UA: 50 mg/dL — AB
Ketones, ur: NEGATIVE mg/dL
Nitrite: NEGATIVE
Protein, ur: 100 mg/dL — AB
SPECIFIC GRAVITY, URINE: 1.013 (ref 1.005–1.030)
pH: 5 (ref 5.0–8.0)

## 2017-12-28 LAB — GLUCOSE, CAPILLARY
GLUCOSE-CAPILLARY: 176 mg/dL — AB (ref 65–99)
Glucose-Capillary: 178 mg/dL — ABNORMAL HIGH (ref 65–99)
Glucose-Capillary: 133 mg/dL — ABNORMAL HIGH (ref 65–99)

## 2017-12-28 LAB — TROPONIN I: Troponin I: <0.03 ng/mL (ref <0.03)

## 2017-12-28 LAB — CBC WITH DIFFERENTIAL/PLATELET
Basophils Absolute: 0 10*3/uL (ref 0–0.1)
Basophils Relative: 0 %
EOS ABS: 0 10*3/uL (ref 0–0.7)
Eosinophils Relative: 0 %
HCT: 34.6 % — ABNORMAL LOW (ref 35.0–47.0)
HEMOGLOBIN: 10.7 g/dL — AB (ref 12.0–16.0)
LYMPHS PCT: 34 %
Lymphs Abs: 1.5 10*3/uL (ref 1.0–3.6)
MCH: 21.7 pg — ABNORMAL LOW (ref 26.0–34.0)
MCHC: 30.8 g/dL — ABNORMAL LOW (ref 32.0–36.0)
MCV: 70.7 fL — ABNORMAL LOW (ref 80.0–100.0)
Monocytes Absolute: 0.5 10*3/uL (ref 0.2–0.9)
Monocytes Relative: 11 %
NEUTROS ABS: 2.4 10*3/uL (ref 1.4–6.5)
NEUTROS PCT: 55 %
PLATELETS: 109 10*3/uL — AB (ref 150–440)
RBC: 4.9 MIL/uL (ref 3.80–5.20)
RDW: 16.4 % — ABNORMAL HIGH (ref 11.5–14.5)
WBC: 4.3 10*3/uL (ref 3.6–11.0)

## 2017-12-28 LAB — MAGNESIUM: MAGNESIUM: 1.8 mg/dL (ref 1.7–2.4)

## 2017-12-28 MED ORDER — FAMOTIDINE 20 MG PO TABS
10.0000 mg | ORAL_TABLET | Freq: Every day | ORAL | Status: DC
  Administered 2017-12-28 – 2017-12-30 (×3): 10 mg via ORAL
  Filled 2017-12-28 (×3): qty 1

## 2017-12-28 MED ORDER — RIVAROXABAN 15 MG PO TABS: 15.0000 mg | ORAL_TABLET | Freq: Every day | ORAL | Status: DC

## 2017-12-28 MED ORDER — INSULIN ASPART 100 UNIT/ML ~~LOC~~ SOLN: 0.0000 [IU] | Freq: Every day | SUBCUTANEOUS | Status: DC

## 2017-12-28 MED ORDER — INSULIN ASPART 100 UNIT/ML ~~LOC~~ SOLN
0.0000 [IU] | Freq: Three times a day (TID) | SUBCUTANEOUS | Status: DC
  Administered 2017-12-28: 2 [IU] via SUBCUTANEOUS
  Administered 2017-12-28: 1 [IU] via SUBCUTANEOUS
  Administered 2017-12-29: 2 [IU] via SUBCUTANEOUS
  Administered 2017-12-29: 3 [IU] via SUBCUTANEOUS
  Administered 2017-12-29 – 2017-12-30 (×2): 2 [IU] via SUBCUTANEOUS
  Administered 2017-12-30: 12:00:00 1 [IU] via SUBCUTANEOUS
  Filled 2017-12-28 (×7): qty 1

## 2017-12-28 MED ORDER — ACETAMINOPHEN 325 MG PO TABS: 650.0000 mg | ORAL_TABLET | Freq: Four times a day (QID) | ORAL | Status: DC | PRN

## 2017-12-28 MED ORDER — SODIUM CHLORIDE 0.9 % IV SOLN
INTRAVENOUS | Status: DC
  Administered 2017-12-28 – 2017-12-29 (×3): via INTRAVENOUS

## 2017-12-28 MED ORDER — INSULIN DETEMIR 100 UNIT/ML ~~LOC~~ SOLN
10.0000 [IU] | Freq: Two times a day (BID) | SUBCUTANEOUS | Status: DC
Start: 2017-12-28 — End: 2017-12-30
  Administered 2017-12-28 – 2017-12-30 (×4): 10 [IU] via SUBCUTANEOUS
  Filled 2017-12-28 (×5): qty 0.1

## 2017-12-28 MED ORDER — ONDANSETRON HCL 4 MG/2ML IJ SOLN: 4.0000 mg | Freq: Four times a day (QID) | INTRAMUSCULAR | Status: DC | PRN

## 2017-12-28 MED ORDER — CHOLECALCIFEROL 10 MCG (400 UNIT) PO TABS
400.0000 [IU] | ORAL_TABLET | Freq: Every day | ORAL | Status: DC
  Administered 2017-12-28 – 2017-12-30 (×3): 400 [IU] via ORAL
  Filled 2017-12-28 (×3): qty 1

## 2017-12-28 MED ORDER — ACETAMINOPHEN 650 MG RE SUPP: 650.0000 mg | Freq: Four times a day (QID) | RECTAL | Status: DC | PRN

## 2017-12-28 MED ORDER — HYDROCODONE-ACETAMINOPHEN 5-325 MG PO TABS: 1.0000 | ORAL_TABLET | ORAL | Status: DC | PRN

## 2017-12-28 MED ORDER — METOPROLOL SUCCINATE ER 25 MG PO TB24
25.0000 mg | ORAL_TABLET | Freq: Every day | ORAL | Status: DC
  Administered 2017-12-28 – 2017-12-29 (×2): 25 mg via ORAL
  Filled 2017-12-28 (×2): qty 1

## 2017-12-28 MED ORDER — RIVAROXABAN 20 MG PO TABS
20.0000 mg | ORAL_TABLET | Freq: Every day | ORAL | Status: DC
  Filled 2017-12-28: qty 1

## 2017-12-28 MED ORDER — VENLAFAXINE HCL 37.5 MG PO TABS
37.5000 mg | ORAL_TABLET | Freq: Two times a day (BID) | ORAL | Status: DC
  Administered 2017-12-28 – 2017-12-30 (×4): 37.5 mg via ORAL
  Filled 2017-12-28 (×5): qty 1

## 2017-12-28 MED ORDER — ALBUTEROL SULFATE (2.5 MG/3ML) 0.083% IN NEBU: 2.5000 mg | INHALATION_SOLUTION | RESPIRATORY_TRACT | Status: DC | PRN

## 2017-12-28 MED ORDER — MAGNESIUM OXIDE 400 (241.3 MG) MG PO TABS
800.0000 mg | ORAL_TABLET | Freq: Every day | ORAL | Status: DC
  Administered 2017-12-28 – 2017-12-30 (×3): 800 mg via ORAL
  Filled 2017-12-28 (×3): qty 2

## 2017-12-28 MED ORDER — BISACODYL 5 MG PO TBEC: 5.0000 mg | DELAYED_RELEASE_TABLET | Freq: Every day | ORAL | Status: DC | PRN

## 2017-12-28 MED ORDER — INSULIN NPH (HUMAN) (ISOPHANE) 100 UNIT/ML ~~LOC~~ SUSP: 25.0000 [IU] | Freq: Two times a day (BID) | SUBCUTANEOUS | Status: DC

## 2017-12-28 MED ORDER — RIVAROXABAN 15 MG PO TABS
15.0000 mg | ORAL_TABLET | Freq: Every day | ORAL | Status: DC
  Administered 2017-12-28: 16:00:00 15 mg via ORAL
  Filled 2017-12-28: qty 1

## 2017-12-28 MED ORDER — AMLODIPINE BESYLATE 5 MG PO TABS
5.0000 mg | ORAL_TABLET | Freq: Every day | ORAL | Status: DC
  Administered 2017-12-28 – 2017-12-30 (×3): 5 mg via ORAL
  Filled 2017-12-28 (×3): qty 1

## 2017-12-28 MED ORDER — DONEPEZIL HCL 5 MG PO TABS
10.0000 mg | ORAL_TABLET | Freq: Every day | ORAL | Status: DC
  Administered 2017-12-28 – 2017-12-29 (×2): 10 mg via ORAL
  Filled 2017-12-28: qty 2
  Filled 2017-12-28: qty 1
  Filled 2017-12-28: qty 2

## 2017-12-28 MED ORDER — SENNOSIDES-DOCUSATE SODIUM 8.6-50 MG PO TABS: 1.0000 | ORAL_TABLET | Freq: Every evening | ORAL | Status: DC | PRN

## 2017-12-28 MED ORDER — VITAMIN B-12 1000 MCG PO TABS
1000.0000 ug | ORAL_TABLET | Freq: Every day | ORAL | Status: DC
Start: 2017-12-28 — End: 2017-12-30
  Administered 2017-12-28 – 2017-12-30 (×3): 1000 ug via ORAL
  Filled 2017-12-28 (×3): qty 1

## 2017-12-28 MED ORDER — SODIUM CHLORIDE 0.9 % IV SOLN
1.0000 g | Freq: Once | INTRAVENOUS | Status: AC
  Administered 2017-12-28: 1 g via INTRAVENOUS
  Filled 2017-12-28: qty 10

## 2017-12-28 MED ORDER — ATORVASTATIN CALCIUM 20 MG PO TABS
20.0000 mg | ORAL_TABLET | Freq: Every day | ORAL | Status: DC
  Administered 2017-12-28 – 2017-12-29 (×2): 20 mg via ORAL
  Filled 2017-12-28 (×2): qty 1

## 2017-12-28 MED ORDER — CEFTRIAXONE SODIUM 1 G IJ SOLR
1.0000 g | INTRAMUSCULAR | Status: DC
Start: 2017-12-29 — End: 2017-12-30
  Administered 2017-12-29 – 2017-12-30 (×2): 1 g via INTRAVENOUS
  Filled 2017-12-28 (×2): qty 10

## 2017-12-28 MED ORDER — ONDANSETRON HCL 4 MG PO TABS: 4.0000 mg | ORAL_TABLET | Freq: Four times a day (QID) | ORAL | Status: DC | PRN

## 2017-12-28 MED ORDER — POTASSIUM CHLORIDE CRYS ER 20 MEQ PO TBCR: 20.0000 meq | EXTENDED_RELEASE_TABLET | Freq: Every day | ORAL | Status: DC

## 2017-12-28 NOTE — ED Notes (Signed)
Called report to Suanne MarkerAlana Hodges RN

## 2017-12-28 NOTE — Progress Notes (Signed)
Wendy [name] Cancellation Note  Patient Details Name: Wendy [name] MRN: [id number] DOB: [birth year]   Cancelled Treatment:    Reason Eval/Treat Not Completed: Patient at procedure or test/unavailable. Chart reviewed, RN consulted. Holding Wendy [name] treatment at this time due to Wendy [name] being off floor for MRI. Wendy [name] returns to room asleep s/p history is taken from husband. Neuro consult pending. Wendy [name] to attempt again at later date/time.   2:49 PM, 12/28/17 Wendy [name], Wendy [name], Wendy [name] Physical Therapist - [place] [phone](ASCOM)  [phone] (mobile)     Wendy [name] 12/28/2017, 2:48 PM

## 2017-12-28 NOTE — ED Notes (Addendum)
As patient was leaving for unit, unit called and stated room 106 is currently under maintenance, will take approximately 15 minutes to complete and will call when complete.

## 2017-12-28 NOTE — ED Provider Notes (Signed)
[facility] Emergency Department Provider Note  ____________________________________________   First MD Initiated Contact with Patient 12/28/17 0515     (approximate)  I have reviewed the triage vital signs and the nursing notes.   HISTORY  Chief Complaint Fall and Altered Mental Status  Level 5 exemption history limited by the patient's clinical condition  HPI Wendy [name] is a 72 y.o. female who comes to the emergency department by EMS after reported fall with decreased level of consciousness.  According to EMS the call was initially for a fall and her husband was unable to get her up and back into bed.  When they arrived they noted that her blood sugar was 90 with normal vital signs however the patient was somewhat somnolent.  She apparently has a long-standing history of diabetes mellitus as well as previous stroke with left-sided deficits.  She left the nursing home 2 days ago.  The patient herself has no complaints at this time.  Past Medical History:  Diagnosis Date  . Stroke [facility](HCC)     Patient Active Problem List   Diagnosis Date Noted  . DIABETES MELLITUS, TYPE II 01/18/2011  . LABYRINTHITIS, ACUTE 01/18/2011  . HYPERTENSION 01/18/2011    No past surgical history on file.  Prior to Admission medications   Not on File    Allergies Aspirin  Family History  Problem Relation Age of Onset  . Breast cancer Neg Hx     Social History Social History   Tobacco Use  . Smoking status: Never Smoker  . Smokeless tobacco: Never Used  Substance Use Topics  . Alcohol use: No    Frequency: Never  . Drug use: No    Review of Systems Level 5 exemption history limited by the patient's clinical condition ____________________________________________   PHYSICAL EXAM:  VITAL SIGNS: ED Triage Vitals  Enc Vitals Group     BP      Pulse      Resp      Temp      Temp src      SpO2      Weight      Height      Head Circumference    [facility] Flow      Pain Score      Pain Loc      Pain Edu?      Excl. in GC?     Constitutional: Lethargic appearing although responsive to verbal stimulus speaks quietly no acute distress Eyes: PERRL EOMI. Head: Atraumatic. Nose: No congestion/rhinnorhea. Mouth/Throat: No trismus Neck: No stridor.   Cardiovascular: Normal rate, regular rhythm. Grossly normal heart sounds.  Good peripheral circulation. Respiratory: Normal respiratory effort.  No retractions. Lungs CTAB and moving good air Gastrointestinal: Obese soft nontender Musculoskeletal: No lower extremity edema   Neurologic: The patient responds to verbal stimulus.  She has significant weakness in her left upper and left lower extremities with 1-2 out of 5 strength in 4-5 strength in right upper and right lower Skin:  Skin is warm, dry and intact. No rash noted. Psychiatric: Somewhat decreased level of consciousness.    ____________________________________________   DIFFERENTIAL includes but not limited to  Stroke, intracerebral hemorrhage, urinary tract infection, pneumonia, dehydration, metabolic derangement ____________________________________________   LABS (all labs ordered are listed, but only abnormal results are displayed)  Labs Reviewed  URINALYSIS, COMPLETE (UACMP) WITH MICROSCOPIC - Abnormal; Notable for the following components:      Result Value   Color, Urine YELLOW (*)  APPearance HAZY (*)    Glucose, UA 50 (*)    Hgb urine dipstick MODERATE (*)    Protein, ur 100 (*)    Leukocytes, UA MODERATE (*)    Bacteria, UA RARE (*)    Squamous Epithelial / LPF 0-5 (*)    All other components within normal limits  URINE CULTURE  COMPREHENSIVE METABOLIC PANEL  CBC WITH DIFFERENTIAL/PLATELET  TROPONIN I    Urinalysis reviewed by me consistent with urinary tract infection __________________________________________  EKG  ED ECG REPORT I, Merrily Brittle, the attending physician, personally viewed and  interpreted this ECG.  Date: 12/28/2017 EKG Time:  Rate: 63 Rhythm: normal sinus rhythm QRS Axis: normal Intervals: normal ST/T Wave abnormalities: Left ventricular hypertrophy with repolarization abnormalities Narrative Interpretation: no evidence of acute ischemia.  I appreciate that the computer is reading this as an acute myocardial infarction however I disagree  ____________________________________________  RADIOLOGY  Head CT reviewed by me with no acute disease Chest x-ray reviewed by me with no acute disease ____________________________________________   PROCEDURES  Procedure(s) performed: no  Procedures  Critical Care performed: no  Observation: no ____________________________________________   INITIAL IMPRESSION / ASSESSMENT AND PLAN / ED COURSE  Pertinent labs & imaging results that were available during my care of the patient were reviewed by me and considered in my medical decision making (see chart for details).  The patient arrives with decreased level of consciousness although is responsive to verbal stimulus and obeying commands.  She has significant left-sided deficits which on chart review her old.  Differential is broad but includes intracerebral hemorrhage versus urinary tract infection versus sepsis etc.  Head CT, in and out urinalysis, blood work are pending.  The patient's urinalysis is consistent with infection.  Culture sent and a gram of ceftriaxone and now.  As the patient is unable to get up and ambulate on her own and significantly deep patient admission for IV antibiotics, IV fluids, and continued monitoring.  I discussed with the patient who nodded understanding and agreed with the plan.  I then discussed with the hospitalist Dr. Sheryle Hail who has graciously agreed to admit the patient to his service.      ____________________________________________   FINAL CLINICAL IMPRESSION(S) / ED DIAGNOSES  Final diagnoses:  Lower urinary tract  infectious disease  Altered mental status, unspecified altered mental status type      NEW MEDICATIONS STARTED DURING THIS VISIT:  New Prescriptions   No medications on file     Note:  This document was prepared using Dragon voice recognition software and may include unintentional dictation errors.     Merrily Brittle, MD 12/28/17 [phone]

## 2017-12-28 NOTE — ED Notes (Signed)
Needs redraw of dark green tube, lab notified to redraw at approximately 0700, they stated they would be able to draw it in about an hour, as per night nurse.

## 2017-12-28 NOTE — ED Triage Notes (Signed)
Patient arrived from home, after coming from [facility] for rehab.  Pt has had hx of stroke, and tonight is "lethargic" per EMS.  Pt states she fell and has pain on her butt from where she fell.  She is co pain in mid sacral area.  Inspection of the back reveals no bruises but on palpation patient states it hurts.

## 2017-12-28 NOTE — Progress Notes (Signed)
Pharmacy Antibiotic Note  Krystle I Kaupp Venetia Constableis a 72 y.o. female admitted on 12/28/2017 with UTI.  Pharmacy has been consulted for ceftriaxone dosing.  Plan: Ceftriaxone 1 g IV q24h  Weight: 147 lb 14.9 oz (67.1 kg)  Temp (24hrs), Avg:97.8 F (36.6 C), Min:97.8 F (36.6 C), Max:97.8 F (36.6 C)  Recent Labs  Lab 12/28/17 0522  WBC 4.3    CrCl cannot be calculated (Patient's most recent lab result is older than the maximum 21 days allowed.).    Allergies  Allergen Reactions  . Aspirin     Antimicrobials this admission: Ceftriaxone 2/23 >>  Dose adjustments this admission:   Microbiology results: UCx pending  Thank you for allowing pharmacy to be a part of this patient's care.  Marty HeckWang, Josede Cicero L 12/28/2017 7:09 AM

## 2017-12-28 NOTE — ED Notes (Signed)
Lab called and stated the green top hemolyzed and I told them the patient had been stuck multiple times by multiple people and they would need to come for the draw.  They stated it would be some time before they could get the sample.  I told day shift nurse Vonna Kotyk(Jay) he was welcome to try and update them if he was successful, otherwise lab would be down when they could.

## 2017-12-28 NOTE — Consult Note (Signed)
Reason for Consult:AMS Referring Physician: Imogene Burn  CC: AMS  HPI: Wendy [name] is an 72 y.o. female with a history of stroke and dementia on Xarelto who per report of husband about 2 weeks ago had altered mental status.  Was evaluated at [facility] with negative work up.  Patient returned to baseline.  On yesterday seemed to do well early in the day.  In the evening when the husband went to get her up to go to bed at about 1030p she was unresponsive.  He rested next to her and early this morning he attempted to get her up again and she  Was still unresponsive.  With no improvement she was brought I for evaluation.  Does report that she awakened this morning after admission and ate breakfast before going back to bed.  Patient unable to participate in history due to mental status.  Past Medical History:  Diagnosis Date  . Alzheimer's dementia   . Depression   . Diabetes mellitus without complication (HCC)   . GERD (gastroesophageal reflux disease)   . Hyperlipidemia   . Hypertension   . Seizure disorder (HCC)   . Stroke (HCC)   . Urinary retention   . UTI (urinary tract infection)     Past Surgical History:  Procedure Laterality Date  . ABDOMINAL HYSTERECTOMY    . APPENDECTOMY    . HEMIARTHROPLASTY SHOULDER FRACTURE    . JOINT REPLACEMENT      Family History  Problem Relation Age of Onset  . CAD Mother   . Diabetes Father   . CAD Sister   . Breast cancer Neg Hx     Social History:  reports that  has never smoked. she has never used smokeless tobacco. She reports that she does not drink alcohol or use drugs.  Allergies  Allergen Reactions  . Aspirin     Medications:  I have reviewed the patient's current medications. Prior to Admission:  Medications Prior to Admission  Medication Sig Dispense Refill Last Dose  . amLODipine (NORVASC) 5 MG tablet Take 5 mg by mouth daily.   12/27/2017 at Unknown time  . atorvastatin (LIPITOR) 20 MG tablet Take 20 mg by mouth at bedtime.    12/27/2017 at Unknown time  . cholecalciferol (VITAMIN D) 400 units TABS tablet Take 400 Units by mouth daily.   12/27/2017 at Unknown time  . donepezil (ARICEPT) 10 MG tablet Take 10 mg by mouth at bedtime.   12/27/2017 at Unknown time  . insulin aspart (NOVOLOG) 100 UNIT/ML injection Inject 0-6 Units into the skin 3 (three) times daily before meals. SSI: 70-200 --- 0 units, 201-250 -- 2 units, 251-300 -- 4 units, 301-350 --- 6 units, 351+ -- 8 units   12/27/2017 at Unknown time  . insulin NPH Human (HUMULIN N,NOVOLIN N) 100 UNIT/ML injection Inject 25 Units into the skin 2 (two) times daily.   12/27/2017 at Unknown time  . magnesium oxide (MAG-OX) 400 MG tablet Take 800 mg by mouth daily.   12/27/2017 at Unknown time  . metoprolol succinate (TOPROL-XL) 25 MG 24 hr tablet Take 25 mg by mouth at bedtime.   12/27/2017 at Unknown time  . potassium chloride SA (K-DUR,KLOR-CON) 20 MEQ tablet Take 20 mEq by mouth daily.   12/27/2017 at Unknown time  . ranitidine (ZANTAC) 75 MG tablet Take 75 mg by mouth at bedtime.   12/27/2017 at Unknown time  . rivaroxaban (XARELTO) 20 MG TABS tablet Take 20 mg by mouth daily.   12/27/2017  at Unknown time  . venlafaxine (EFFEXOR) 37.5 MG tablet Take 37.5 mg by mouth 2 (two) times daily.   12/27/2017 at Unknown time  . vitamin B-12 (CYANOCOBALAMIN) 1000 MCG tablet Take 1,000 mcg by mouth daily.   12/27/2017 at Unknown time   Scheduled: . amLODipine  5 mg Oral Daily  . atorvastatin  20 mg Oral QHS  . cholecalciferol  400 Units Oral Daily  . donepezil  10 mg Oral QHS  . famotidine  10 mg Oral Daily  . insulin aspart  0-5 Units Subcutaneous QHS  . insulin aspart  0-9 Units Subcutaneous TID WC  . insulin detemir  10 Units Subcutaneous BID  . magnesium oxide  800 mg Oral Daily  . metoprolol succinate  25 mg Oral QHS  . Rivaroxaban  15 mg Oral Q supper  . venlafaxine  37.5 mg Oral BID WC  . vitamin B-12  1,000 mcg Oral Daily    ROS: Unable to provide due to mental  status  Physical Examination: Blood pressure 128/73, pulse (!) 59, temperature 97.7 F (36.5 C), temperature source Axillary, resp. rate 20, height 5' 0.98" (1.549 m), weight 73.9 kg (162 lb 14.4 oz), SpO2 95 %.  HEENT-  Normocephalic, no lesions, without obvious abnormality.  Normal external eye and conjunctiva.  Normal TM's bilaterally.  Normal auditory canals and external ears. Normal external nose, mucus membranes and septum.  Normal pharynx. Cardiovascular- S1, S2 normal, pulses palpable throughout   Lungs- chest clear, no wheezing, rales, normal symmetric air entry, Heart exam - S1, S2 normal, no murmur, no gallop, rate regular Abdomen- soft, non-tender; bowel sounds normal; no masses,  no organomegaly Extremities- LE edema Lymph-no adenopathy palpable Musculoskeletal-no joint tenderness, deformity or swelling Skin-warm and dry, no hyperpigmentation, vitiligo, or suspicious lesions  Neurological Examination   Mental Status: Patient in bed with eyes closed.  With sternal rub opens eyes and follows some simple commands.  No speech. . Cranial Nerves: II: Discs flat bilaterally; Does not blink to bilateral confrontation., pupils equal, round, reactive to light and accommodation III,IV, VI: left ptosis, intact oculocephalic maneuvers bilaterally V,VII: left facial droop,  Unable to test facial sensation VIII: hearing normal bilaterally IX,X: gag reflex reduced XI: unable to perform XII: midline tongue extension Motor: Spontaneous RUE movement against gravity.  No movement noted in the LUE.  Movement noted in the lower extremities Sensory: Responds to noxious stimuli in all extremities Deep Tendon Reflexes: 1+ throughout with absent AJ's bilaterally Plantars: Right: mute   Left: mute Cerebellar: Unable to perform due to mental status Gait: not tested due to safety concerns    Laboratory Studies:   Basic Metabolic Panel: Recent Labs  Lab 12/28/17 0727  NA 140  K 5.4*  CL  105  CO2 28  GLUCOSE 93  BUN 14  CREATININE 1.03*  CALCIUM 9.2  MG 1.8    Liver Function Tests: Recent Labs  Lab 12/28/17 0727  AST 50*  ALT 59*  ALKPHOS 65  BILITOT 0.5  PROT 7.3  ALBUMIN 3.3*   No results for input(s): LIPASE, AMYLASE in the last 168 hours. No results for input(s): AMMONIA in the last 168 hours.  CBC: Recent Labs  Lab 12/28/17 0522  WBC 4.3  NEUTROABS 2.4  HGB 10.7*  HCT 34.6*  MCV 70.7*  PLT 109*    Cardiac Enzymes: Recent Labs  Lab 12/28/17 0727  TROPONINI <0.03    BNP: Invalid input(s): POCBNP  CBG: Recent Labs  Lab 12/28/17 1156  GLUCAP 178*    Microbiology: No results found for this or any previous visit.  Coagulation Studies: No results for input(s): LABPROT, INR in the last 72 hours.  Urinalysis:  Recent Labs  Lab 12/28/17 0522  COLORURINE YELLOW*  LABSPEC 1.013  PHURINE 5.0  GLUCOSEU 50*  HGBUR MODERATE*  BILIRUBINUR NEGATIVE  KETONESUR NEGATIVE  PROTEINUR 100*  NITRITE NEGATIVE  LEUKOCYTESUR MODERATE*    Lipid Panel:  No results found for: CHOL, TRIG, HDL, CHOLHDL, VLDL, LDLCALC  HgbA1C: No results found for: HGBA1C  Urine Drug Screen:  No results found for: LABOPIA, COCAINSCRNUR, LABBENZ, AMPHETMU, THCU, LABBARB  Alcohol Level: No results for input(s): ETH in the last 168 hours.  Other results: EKG: sinus rhythm at 63 bpm.  Imaging: Dg Chest 1 View  Result Date: 12/28/2017 CLINICAL DATA:  Cough and weakness tonight. EXAM: CHEST 1 VIEW COMPARISON:  12/01/2012.  05/22/2010. FINDINGS: Artifact overlies the chest. Heart size upper limits of normal. Chronic aortic atherosclerosis. Mild patchy density in the medial left lower lobe could be atelectasis or mild pneumonia. This was not seen on previous studies. No evidence of heart failure or effusion. No significant bone finding. IMPRESSION: Borderline cardiomegaly. Aortic atherosclerosis. Mild patchy density at the medial left base could represent  atelectasis or mild left base pneumonia. Electronically Signed   By: Paulina FusiMark  Shogry M.D.   On: 12/28/2017 06:34   Ct Head Wo Contrast  Result Date: 12/28/2017 CLINICAL DATA:  Previous history of stroke with worsening of lethargy. Fell in the bathroom. EXAM: CT HEAD WITHOUT CONTRAST TECHNIQUE: Contiguous axial images were obtained from the base of the skull through the vertex without intravenous contrast. COMPARISON:  02/05/2013 FINDINGS: Brain: Generalized brain atrophy. Chronic small-vessel ischemic changes throughout the cerebral hemispheric white matter. Chronic atrophy and ischemic changes of the brainstem. Tiny brainstem hyperdensity on 1 image could be calcification or a tiny hemorrhage. Old infarction in the right basal ganglia and internal capsule region. No sign of acute infarction, mass lesion, subarachnoid hemorrhage, hydrocephalus or extra-axial collection. Vascular: There is atherosclerotic calcification of the major vessels at the base of the brain. Skull: Normal Sinuses/Orbits: Clear/normal Other: None IMPRESSION: No definite acute finding. Brain atrophy including pronounced atrophic changes of the brainstem/midbrain. Old ischemic changes throughout the hemispheric white matter with old infarction in the right basal ganglia, thalami and throughout the brainstem. Punctate hyperdensity in the left side of the pons could be chronic calcification related to the previous infarctions, though a left pontine microhemorrhage is not excluded. Electronically Signed   By: Paulina FusiMark  Shogry M.D.   On: 12/28/2017 06:33   Mr Brain Wo Contrast  Result Date: 12/28/2017 CLINICAL DATA:  Altered level of consciousness. Fall. Abnormal CT scan. EXAM: MRI HEAD WITHOUT CONTRAST TECHNIQUE: Multiplanar, multiecho pulse sequences of the brain and surrounding structures were obtained without intravenous contrast. COMPARISON:  CT head without contrast from the same day. MRI brain 07/22/2012 FINDINGS: Brain: The  diffusion-weighted images demonstrate no acute or subacute infarction. Stable extensive remote white matter disease is evident in the pons. No discrete hemorrhage is present. Remote lacunar infarcts are present in the basal ganglia bilaterally, right greater than left. Ex vacuo dilation is associated. Extensive periventricular white matter disease is noted bilaterally. No significant extra-axial fluid collection is present. The internal auditory canals are within normal limits bilaterally. Vascular: Flow is present in the major intracranial arteries. Skull and upper cervical spine: The skull base is otherwise within normal limits. The craniocervical junction is normal. The upper cervical  spine is within normal limits. Sinuses/Orbits: The paranasal sinuses are clear. Mastoid effusions are present bilaterally. No obstructing nasopharyngeal lesion is present. Globes and orbits are within normal limits. IMPRESSION: 1. No acute intracranial abnormality. 2. No evidence for significant hemorrhage of the brainstem. This may be focal calcification related to prior infarct. 3. Remote infarcts involving the pons and right greater than left basal ganglia. Electronically Signed   By: Marin Roberts M.D.   On: 12/28/2017 15:07     Assessment/Plan: 72 year old female with a history of stroke and dementia who presents with altered mental status.  Per husband patient with a history of similar presentation with UTI in the past.  Urinalysis shows evidence of UTI.  Head CT reviewed and shows small vessel disease and chronic infarcts.  Questionable punctate left pontine hemorrhage.  MRI of the brain performed in follow up.  MRI of the brain reviewed and shows no evidence of acute infarct or hemorrhage.  Mental status likely secondary to encephalopathy related to infection.  Agree with antibiotic therapy.  No further neurologic intervention is recommended at this time.  If further questions arise, please call or page at that  time.  Thank you for allowing neurology to participate in the care of this patient. Case discussed with Dr. Duane Lope, MD Neurology [phone] 12/28/2017  5:45 PM

## 2017-12-28 NOTE — H&P (Addendum)
[facility] [employer] at [facility]   PATIENT NAME: Wendy [name]    MR#:  [id number]  DATE OF BIRTH:  [birth year]  DATE OF ADMISSION:  12/28/2017  PRIMARY CARE PHYSICIAN: Leim Fabry, MD   REQUESTING/REFERRING PHYSICIAN: Merrily Brittle, MD  CHIEF COMPLAINT:   Chief Complaint  Patient presents with  . Fall  . Altered Mental Status   Confusion and fall last night. HISTORY OF PRESENT ILLNESS:  Wendy [name]  is a 72 y.o. female with a known history of multiple medical problems as below.  The patient was just discharged from nursing facility 2 days ago.  She was found confused, weak in the fall last night.  She was sent to ED and was found UTI.  The patient is unresponsive to stimuli.  CAT scan of the head did not show any acute abnormality.  All information was obtained from her husband and the ED physician.  PAST MEDICAL HISTORY:   Past Medical History:  Diagnosis Date  . Alzheimer's dementia   . Depression   . Diabetes mellitus without complication (HCC)   . GERD (gastroesophageal reflux disease)   . Hyperlipidemia   . Hypertension   . Seizure disorder (HCC)   . Stroke (HCC)   . Urinary retention   . UTI (urinary tract infection)     PAST SURGICAL HISTORY:   Past Surgical History:  Procedure Laterality Date  . ABDOMINAL HYSTERECTOMY    . APPENDECTOMY    . HEMIARTHROPLASTY SHOULDER FRACTURE    . JOINT REPLACEMENT      SOCIAL HISTORY:   Social History   Tobacco Use  . Smoking status: Never Smoker  . Smokeless tobacco: Never Used  Substance Use Topics  . Alcohol use: No    Frequency: Never    FAMILY HISTORY:   Family History  Problem Relation Age of Onset  . CAD Mother   . Diabetes Father   . CAD Sister   . Breast cancer Neg Hx     DRUG ALLERGIES:   Allergies  Allergen Reactions  . Aspirin     REVIEW OF SYSTEMS:   Review of Systems  Unable to perform ROS: Mental status change    MEDICATIONS AT HOME:   Prior to  Admission medications   Medication Sig Start Date End Date Taking? Authorizing Provider  amLODipine (NORVASC) 5 MG tablet Take 5 mg by mouth daily.   Yes [provider]  atorvastatin (LIPITOR) 20 MG tablet Take 20 mg by mouth at bedtime.   Yes [provider]  cholecalciferol (VITAMIN D) 400 units TABS tablet Take 400 Units by mouth daily.   Yes [provider]  donepezil (ARICEPT) 10 MG tablet Take 10 mg by mouth at bedtime.   Yes [provider]  insulin aspart (NOVOLOG) 100 UNIT/ML injection Inject 0-6 Units into the skin 3 (three) times daily before meals. SSI: 70-200 --- 0 units, 201-250 -- 2 units, 251-300 -- 4 units, 301-350 --- 6 units, 351+ -- 8 units   Yes [provider]  insulin NPH Human (HUMULIN N,NOVOLIN N) 100 UNIT/ML injection Inject 25 Units into the skin 2 (two) times daily.   Yes [provider]  magnesium oxide (MAG-OX) 400 MG tablet Take 800 mg by mouth daily.   Yes [provider]  metoprolol succinate (TOPROL-XL) 25 MG 24 hr tablet Take 25 mg by mouth at bedtime.   Yes [provider]  potassium chloride SA (K-DUR,KLOR-CON) 20 MEQ tablet Take 20 mEq  by mouth daily.   Yes [provider]  ranitidine (ZANTAC) 75 MG tablet Take 75 mg by mouth at bedtime.   Yes [provider]  rivaroxaban (XARELTO) 20 MG TABS tablet Take 20 mg by mouth daily.   Yes [provider]  venlafaxine (EFFEXOR) 37.5 MG tablet Take 37.5 mg by mouth 2 (two) times daily.   Yes [provider]  vitamin B-12 (CYANOCOBALAMIN) 1000 MCG tablet Take 1,000 mcg by mouth daily.   Yes [provider]      VITAL SIGNS:  Blood pressure 138/80, pulse 63, temperature 97.8 F (36.6 C), temperature source Oral, resp. rate 13, weight 147 lb 14.9 oz (67.1 kg), SpO2 97 %.  PHYSICAL EXAMINATION:  Physical Exam  GENERAL:  72 y.o.-year-old patient lying in the bed, responsive to stimuli.   EYES: The  patient closed open her eyes, unable to exam.   HEENT: Head atraumatic, normocephalic. NECK:  Supple, no jugular venous distention. No thyroid enlargement, no tenderness.  LUNGS: Normal breath sounds bilaterally, no wheezing, rales,rhonchi or crepitation. No use of accessory muscles of respiration.  CARDIOVASCULAR: S1, S2 normal. 2/6 systolic murmurs, no rubs, or gallops.  ABDOMEN: Soft, nontender, nondistended. Bowel sounds present. No organomegaly or mass.  EXTREMITIES: No pedal edema, cyanosis, or clubbing.  NEUROLOGIC: Unable to exam. PSYCHIATRIC: The patient is unresponsive to verbal stimuli and mild responsive to pain stimuli. SKIN: No obvious rash, lesion, or ulcer.   LABORATORY PANEL:   CBC Recent Labs  Lab 12/28/17 0522  WBC 4.3  HGB 10.7*  HCT 34.6*  PLT 109*   ------------------------------------------------------------------------------------------------------------------  Chemistries  No results for input(s): NA, K, CL, CO2, GLUCOSE, BUN, CREATININE, CALCIUM, MG, AST, ALT, ALKPHOS, BILITOT in the last 168 hours.  Invalid input(s): GFRCGP ------------------------------------------------------------------------------------------------------------------  Cardiac Enzymes No results for input(s): TROPONINI in the last 168 hours. ------------------------------------------------------------------------------------------------------------------  RADIOLOGY:  Dg Chest 1 View  Result Date: 12/28/2017 CLINICAL DATA:  Cough and weakness tonight. EXAM: CHEST 1 VIEW COMPARISON:  12/01/2012.  05/22/2010. FINDINGS: Artifact overlies the chest. Heart size upper limits of normal. Chronic aortic atherosclerosis. Mild patchy density in the medial left lower lobe could be atelectasis or mild pneumonia. This was not seen on previous studies. No evidence of heart failure or effusion. No significant bone finding. IMPRESSION: Borderline cardiomegaly. Aortic atherosclerosis. Mild patchy  density at the medial left base could represent atelectasis or mild left base pneumonia. Electronically Signed   By: Paulina FusiMark  Shogry M.D.   On: 12/28/2017 06:34   Ct Head Wo Contrast  Result Date: 12/28/2017 CLINICAL DATA:  Previous history of stroke with worsening of lethargy. Fell in the bathroom. EXAM: CT HEAD WITHOUT CONTRAST TECHNIQUE: Contiguous axial images were obtained from the base of the skull through the vertex without intravenous contrast. COMPARISON:  02/05/2013 FINDINGS: Brain: Generalized brain atrophy. Chronic small-vessel ischemic changes throughout the cerebral hemispheric white matter. Chronic atrophy and ischemic changes of the brainstem. Tiny brainstem hyperdensity on 1 image could be calcification or a tiny hemorrhage. Old infarction in the right basal ganglia and internal capsule region. No sign of acute infarction, mass lesion, subarachnoid hemorrhage, hydrocephalus or extra-axial collection. Vascular: There is atherosclerotic calcification of the major vessels at the base of the brain. Skull: Normal Sinuses/Orbits: Clear/normal Other: None IMPRESSION: No definite acute finding. Brain atrophy including pronounced atrophic changes of the brainstem/midbrain. Old ischemic changes throughout the hemispheric white matter with old infarction in the right basal ganglia, thalami and throughout the brainstem. Punctate hyperdensity in the  left side of the pons could be chronic calcification related to the previous infarctions, though a left pontine microhemorrhage is not excluded. Electronically Signed   By: Paulina Fusi M.D.   On: 12/28/2017 06:33      IMPRESSION AND PLAN:   Acute metabolic encephalopathy, unclear etiology, possible due to UTI. The patient will be admitted to medical floor. Aspiration the fall precaution.  Neuro check, neurology consult. Continue Rocephin IV and follow-up urine culture.  Hyperkalemia. Hold KCl, IVF and f/u BMP.  Generalized weakness.  PT  evaluation.  History of CVA with left-sided weakness Hold Xarelto due to CT reporting Punctate hyperdensity in the left side of the pons could be chronic calcification related to the previous infarctions, though a left pontine microhemorrhage is not excluded. MRI brain per Dr. Thad Ranger.  Diabetes.  Continue home insulin and start sliding scale. Hypertension.  Continue home hypertension medication.  I disucussed with Dr. Thad Ranger. All the records are reviewed and case discussed with ED provider. Management plans discussed with the patient's husband and they are in agreement.  CODE STATUS: Full code  TOTAL TIME TAKING CARE OF THIS PATIENT: 62 minutes.    Shaune Pollack M.D on 12/28/2017 at 7:15 AM  Between 7am to 6pm - Pager - [phone]  After 6pm go to [email or web address] - Social research officer, government  [facility] [place] [facility]  Office  [phone]  CC: Primary care physician; Leim Fabry, MD   Note: This dictation was prepared with Dragon dictation along with smaller phrase technology. Any transcriptional errors that result from this process are unin

## 2017-12-28 NOTE — ED Notes (Signed)
Unit called and stated room is ready now, ok to bring patient up.

## 2017-12-28 NOTE — ED Notes (Signed)
Called tech to transport patient to 106

## 2017-12-29 LAB — GLUCOSE, CAPILLARY
GLUCOSE-CAPILLARY: 151 mg/dL — AB (ref 65–99)
GLUCOSE-CAPILLARY: 200 mg/dL — AB (ref 65–99)
Glucose-Capillary: 240 mg/dL — ABNORMAL HIGH (ref 65–99)
Glucose-Capillary: 178 mg/dL — ABNORMAL HIGH (ref 65–99)

## 2017-12-29 LAB — CBC
HEMATOCRIT: 42.3 % (ref 35.0–47.0)
Hemoglobin: 13.2 g/dL (ref 12.0–16.0)
MCH: 21.6 pg — AB (ref 26.0–34.0)
MCHC: 31.2 g/dL — ABNORMAL LOW (ref 32.0–36.0)
MCV: 69.2 fL — AB (ref 80.0–100.0)
PLATELETS: 123 10*3/uL — AB (ref 150–440)
RBC: 6.11 MIL/uL — AB (ref 3.80–5.20)
RDW: 15.3 % — ABNORMAL HIGH (ref 11.5–14.5)
WBC: 5.4 10*3/uL (ref 3.6–11.0)

## 2017-12-29 LAB — BASIC METABOLIC PANEL
Anion gap: 5 (ref 5–15)
BUN: 19 mg/dL (ref 6–20)
CHLORIDE: 109 mmol/L (ref 101–111)
CO2: 26 mmol/L (ref 22–32)
CREATININE: 0.92 mg/dL (ref 0.44–1.00)
Calcium: 8.9 mg/dL (ref 8.9–10.3)
GFR calc non Af Amer: >60 mL/min (ref >60)
Glucose, Bld: 138 mg/dL — ABNORMAL HIGH (ref 65–99)
POTASSIUM: 4.3 mmol/L (ref 3.5–5.1)
SODIUM: 140 mmol/L (ref 135–145)

## 2017-12-29 LAB — HEMOGLOBIN A1C
Hgb A1c MFr Bld: 7.5 % — ABNORMAL HIGH (ref 4.8–5.6)
Mean Plasma Glucose: 169 mg/dL

## 2017-12-29 MED ORDER — RIVAROXABAN 20 MG PO TABS
20.0000 mg | ORAL_TABLET | Freq: Every day | ORAL | Status: DC
  Administered 2017-12-29: 20 mg via ORAL
  Filled 2017-12-29 (×2): qty 1

## 2017-12-29 NOTE — Progress Notes (Signed)
   [facility] - [place] at [facility]   PATIENT NAME: Wendy [name]    MR#:  [id number]  DATE OF BIRTH:  1946-09-19  SUBJECTIVE:  CHIEF COMPLAINT:   Chief Complaint  Patient presents with  . Fall  . Altered Mental Status    REVIEW OF SYSTEMS:  ROS  DRUG ALLERGIES:   Allergies  Allergen Reactions  . Aspirin    VITALS:  Blood pressure (!) 145/66, pulse 67, temperature 98.7 F (37.1 C), resp. rate 18, height 5' 0.98" (1.549 m), weight 162 lb 14.4 oz (73.9 kg), SpO2 99 %. PHYSICAL EXAMINATION:  Physical Exam LABORATORY PANEL:  Female CBC Recent Labs  Lab 12/29/17 0428  WBC 5.4  HGB 13.2  HCT 42.3  PLT 123*   ------------------------------------------------------------------------------------------------------------------ Chemistries  Recent Labs  Lab 12/28/17 0727 12/29/17 0428  NA 140 140  K 5.4* 4.3  CL 105 109  CO2 28 26  GLUCOSE 93 138*  BUN 14 19  CREATININE 1.03* 0.92  CALCIUM 9.2 8.9  MG 1.8  --   AST 50*  --   ALT 59*  --   ALKPHOS 65  --   BILITOT 0.5  --    RADIOLOGY:  No results found. ASSESSMENT AND PLAN:   Acute metabolic encephalopathy, possible due to UTI. Aspiration the fall precaution.  Improved to baseline.  Continue Rocephin IV and follow-up urine culture and sensitivity.  Hyperkalemia. Hold KCl, improved with IVF.Marland Kitchen.  Generalized weakness.  PT evaluation: HHPT.  History of CVA with left-sided weakness Hold Xarelto due to CT reporting Punctate hyperdensity in the left side of the pons could be chronic calcification related to the previous infarctions, though a left pontine microhemorrhage is not excluded. MRI brain is unremarkable, resume xarelto per Dr. Thad Rangereynolds.  Diabetes.  Continue home insulin and start sliding scale. Hypertension.  Continue home hypertension medication.  All the records are reviewed and case discussed with Care Management/Social Worker. Management plans discussed with the patient,  her husband and they are in agreement.  CODE STATUS: Full Code  TOTAL TIME TAKING CARE OF THIS PATIENT: 32 minutes.   More than 50% of the time was spent in counseling/coordination of care: YES  POSSIBLE D/C IN 1 DAYS, DEPENDING ON CLINICAL CONDITION.   Shaune PollackQing Miko Markwood M.D on 12/29/2017 at 5:04 PM  Between 7am to 6pm - Pager - [phone]  After 6pm go to [email or web address] - Therapist, [email or web address] [email or web address]  [facility] [place] [facility]

## 2017-12-29 NOTE — Evaluation (Signed)
Physical Therapy Evaluation Patient Details Name: Wendy [name] MRN: [id number] DOB: [birth year] Today's Date: 12/29/2017   History of Present Illness  Wendy [name] is a 72yo black female who [facility] on [facility] with AMS adn weakness only 2D s/p DC from [facility]. HHPT services were scheduled to being on 2/25, bu tnot yet commenced. PMH: alzimers dementia, DM, GERD, HLD, HTN, seizures, CVA.   Clinical Impression  Pt admitted with above diagnosis. Pt currently with functional limitations due to the deficits listed below (see "PT Problem List"). Upon entry, the patient is received semirecumbent in bed, husband and other family in room. History collected from husband 1DA while patient was at MRI. The pt is awake and agreeable to participate. No acute distress noted at this time. The pt is pleasant, conversational, and following simple commands consistently. Functional mobility assessment demonstrates mild-moderate strength impairment in trunk and BLE, the pt now requiring MinGuard assist physical assistance for transfers: husband reports this consistent with PLOF at time from DC from STR/SNF 3DA.    Pt will benefit from skilled PT intervention to increase independence and safety with basic mobility in preparation for discharge to the venue listed below.       Follow Up Recommendations Home health PT(resume as scheduled)    Equipment Recommendations  None recommended by PT    Recommendations for Other Services       Precautions / Restrictions Precautions Precautions: Fall Restrictions Weight Bearing Restrictions: No      Mobility  Bed Mobility Overal bed mobility: Needs Assistance Bed Mobility: Supine to Sit     Supine to sit: Supervision     General bed mobility comments: heavy effort and additional time required  Transfers Overall transfer level: Needs assistance Equipment used: 1 person hand held assist Transfers: Stand Pivot  Transfers   Stand pivot transfers: Min guard       General transfer comment: dependent style transfer, follows VC for safety well.   Ambulation/Gait                Stairs            Wheelchair Mobility    Modified Rankin (Stroke Patients Only)       Balance                                             Pertinent Vitals/Pain Pain Assessment: No/denies pain    Home Living Family/patient expects to be discharged to:: Private residence Living Arrangements: Spouse/significant other Available Help at Discharge: Family Type of Home: House Home Access: Stairs to enter Entrance Stairs-Rails: Can reach both Entrance Stairs-Number of Steps: 3   Home Equipment: Walker - 4 wheels;Bedside commode;Wheelchair - manual;Shower seat;Grab bars - tub/shower;Grab bars - toilet      Prior Function Level of Independence: Needs assistance   Gait / Transfers Assistance Needed: Chronic mobility limitations, bed mobility assistance needed, short household AMB distances performed only with RW   ADL's / Homemaking Assistance Needed: Husband assists with bathing, dressing, and toiletting.         Hand Dominance        Extremity/Trunk Assessment                Communication      Cognition Arousal/Alertness: Awake/alert Behavior During Therapy: WFL for tasks  assessed/performed Overall Cognitive Status: Within Functional Limits for tasks assessed                                        General Comments      Exercises     Assessment/Plan    PT Assessment Patient needs continued PT services  PT Problem List Decreased strength;Decreased activity tolerance;Decreased mobility       PT Treatment Interventions Therapeutic exercise;Therapeutic activities;Functional mobility training;Balance training;Patient/family education    PT Goals (Current goals can be found in the Care Plan section)  Acute Rehab PT Goals Patient Stated Goal:  Return to home to continue with PT  PT Goal Formulation: With patient Time For Goal Achievement: 01/12/18 Potential to Achieve Goals: Good    Frequency Min 2X/week   Barriers to discharge        Co-evaluation               AM-PAC PT "6 Clicks" Daily Activity  Outcome Measure Difficulty turning over in bed (including adjusting bedclothes, sheets and blankets)?: A Lot Difficulty moving from lying on back to sitting on the side of the bed? : A Lot Difficulty sitting down on and standing up from a chair with arms (e.g., wheelchair, bedside commode, etc,.)?: A Lot Help needed moving to and from a bed to chair (including a wheelchair)?: A Lot Help needed walking in hospital room?: A Lot Help needed climbing 3-5 steps with a railing? : Total 6 Click Score: 11    End of Session Equipment Utilized During Treatment: Gait belt Activity Tolerance: Patient tolerated treatment well Patient left: with call bell/phone within reach;with family/visitor present;in chair;with SCD's reapplied Nurse Communication: Mobility status(pt wants some ice cream) PT Visit Diagnosis: Muscle weakness (generalized) (M62.81);History of falling (Z91.81);Repeated falls (R29.6)    Time: 9604-5409 PT Time Calculation (min) (ACUTE ONLY): 16 min   Charges:   PT Evaluation $PT Eval Moderate Complexity: 1 Mod     PT G Codes:        3:01 PM, January 02, 2018 Rosamaria Lints, PT, DPT Physical Therapist - [place] [phone] (ASCOM)   [place] C 01-02-18, 2:58 PM

## 2017-12-30 LAB — URINE CULTURE

## 2017-12-30 LAB — GLUCOSE, CAPILLARY
GLUCOSE-CAPILLARY: 151 mg/dL — AB (ref 65–99)
Glucose-Capillary: 147 mg/dL — ABNORMAL HIGH (ref 65–99)

## 2017-12-30 MED ORDER — CEPHALEXIN 500 MG PO CAPS: 500.0000 mg | ORAL_CAPSULE | Freq: Two times a day (BID) | ORAL | 0 refills | Status: DC

## 2017-12-30 MED ORDER — CEPHALEXIN 500 MG PO CAPS
500.0000 mg | ORAL_CAPSULE | Freq: Two times a day (BID) | ORAL | Status: DC
  Filled 2017-12-30: qty 1

## 2017-12-30 NOTE — Discharge Summary (Addendum)
[facility]   PATIENT NAME: Wendy [name]    MR#:  [id number]  DATE OF BIRTH:  11/22/1945  DATE OF ADMISSION:  12/28/2017   ADMITTING PHYSICIAN: Shaune Pollack, MD  DATE OF DISCHARGE: 12/30/2017  PRIMARY CARE PHYSICIAN: Leim Fabry, MD   ADMISSION DIAGNOSIS:  Lower urinary tract infectious disease [N39.0] Altered mental status, unspecified altered mental status type [R41.82] DISCHARGE DIAGNOSIS:  Active Problems:   Acute metabolic encephalopathy  SECONDARY DIAGNOSIS:   Past Medical History:  Diagnosis Date  . Alzheimer's dementia   . Depression   . Diabetes mellitus without complication (HCC)   . GERD (gastroesophageal reflux disease)   . Hyperlipidemia   . Hypertension   . Seizure disorder (HCC)   . Stroke (HCC)   . Urinary retention   . UTI (urinary tract infection)    HOSPITAL COURSE:   Acute metabolic encephalopathy, possible due to UTI. Aspiration the fall precaution.  Improved to baseline. She has been treated with Rocephin IV, urine culture and sensitivity: CITROBACTER KOSERI, sensitive to many antibiotics.  Changed to p.o. Keflex..  Hyperkalemia. Hold KCl, improved with IVF.Marland Kitchen  Generalized weakness. PT evaluation: HHPT.  History of CVA with left-sided weakness HoldXarelto due to CT reportingPunctate hyperdensity in the left side of the pons could be chronic calcification related to the previous infarctions, though a left pontine microhemorrhage is not excluded. MRI brain is unremarkable, resumed xarelto per Dr. Thad Ranger.  Diabetes. Continue home insulin and on sliding scale. Hypertension. Continue home hypertension medication.  DISCHARGE CONDITIONS:  Stable, discharge to home today. CONSULTS OBTAINED:  Treatment Team:  Thana Farr, MD DRUG ALLERGIES:   Allergies  Allergen Reactions  . Aspirin    DISCHARGE MEDICATIONS:   Allergies as of 12/30/2017      Reactions   Aspirin         Medication List    STOP taking these medications   potassium chloride SA 20 MEQ tablet Commonly known as:  K-DUR,KLOR-CON     TAKE these medications   amLODipine 5 MG tablet Commonly known as:  NORVASC Take 5 mg by mouth daily.   atorvastatin 20 MG tablet Commonly known as:  LIPITOR Take 20 mg by mouth at bedtime.   cephALEXin 500 MG capsule Commonly known as:  KEFLEX Take 1 capsule (500 mg total) by mouth every 12 (twelve) hours.   cholecalciferol 400 units Tabs tablet Commonly known as:  VITAMIN D Take 400 Units by mouth daily.   donepezil 10 MG tablet Commonly known as:  ARICEPT Take 10 mg by mouth at bedtime.   insulin aspart 100 UNIT/ML injection Commonly known as:  novoLOG Inject 0-6 Units into the skin 3 (three) times daily before meals. SSI: 70-200 --- 0 units, 201-250 -- 2 units, 251-300 -- 4 units, 301-350 --- 6 units, 351+ -- 8 units   insulin NPH Human 100 UNIT/ML injection Commonly known as:  HUMULIN N,NOVOLIN N Inject 25 Units into the skin 2 (two) times daily.   magnesium oxide 400 MG tablet Commonly known as:  MAG-OX Take 800 mg by mouth daily.   metoprolol succinate 25 MG 24 hr tablet Commonly known as:  TOPROL-XL Take 25 mg by mouth at bedtime.   ranitidine 75 MG tablet Commonly known as:  ZANTAC Take 75 mg by mouth at bedtime.   venlafaxine 37.5 MG tablet Commonly known as:  EFFEXOR Take 37.5 mg by mouth 2 (two) times daily.   vitamin B-12 1000 MCG tablet Commonly  known as:  CYANOCOBALAMIN Take 1,000 mcg by mouth daily.   XARELTO 20 MG Tabs tablet Generic drug:  rivaroxaban Take 20 mg by mouth daily.        DISCHARGE INSTRUCTIONS:  See AVS.  If you experience worsening of your admission symptoms, develop shortness of breath, life threatening emergency, suicidal or homicidal thoughts you must seek medical attention immediately by calling 911 or calling your MD immediately  if symptoms less severe.  You Must read complete  instructions/literature along with all the possible adverse reactions/side effects for all the Medicines you take and that have been prescribed to you. Take any new Medicines after you have completely understood and accpet all the possible adverse reactions/side effects.   Please note  You were cared for by a hospitalist during your hospital stay. If you have any questions about your discharge medications or the care you received while you were in the hospital after you are discharged, you can call the unit and asked to speak with the hospitalist on call if the hospitalist that took care of you is not available. Once you are discharged, your primary care physician will handle any further medical issues. Please note that NO REFILLS for any discharge medications will be authorized once you are discharged, as it is imperative that you return to your primary care physician (or establish a relationship with a primary care physician if you do not have one) for your aftercare needs so that they can reassess your need for medications and monitor your lab values.    On the day of Discharge:  VITAL SIGNS:  Blood pressure (!) 151/78, pulse 64, temperature 97.9 F (36.6 C), temperature source Oral, resp. rate 18, height 5' 0.98" (1.549 m), weight 162 lb 14.4 oz (73.9 kg), SpO2 95 %. PHYSICAL EXAMINATION:  GENERAL:  72 y.o.-year-old patient lying in the bed with no acute distress.  EYES: Pupils equal, round, reactive to light and accommodation. No scleral icterus. Extraocular muscles intact.  HEENT: Head atraumatic, normocephalic. Oropharynx and nasopharynx clear.  NECK:  Supple, no jugular venous distention. No thyroid enlargement, no tenderness.  LUNGS: Normal breath sounds bilaterally, no wheezing, rales,rhonchi or crepitation. No use of accessory muscles of respiration.  CARDIOVASCULAR: S1, S2 normal. No murmurs, rubs, or gallops.  ABDOMEN: Soft, non-tender, non-distended. Bowel sounds present. No  organomegaly or mass.  EXTREMITIES: No pedal edema, cyanosis, or clubbing.  NEUROLOGIC: Cranial nerves II through XII are intact. Muscle strength 5/5 in all extremities. Sensation intact. Gait not checked.  PSYCHIATRIC: The patient is alert and oriented x 3.  SKIN: No obvious rash, lesion, or ulcer.  DATA REVIEW:   CBC Recent Labs  Lab 12/29/17 0428  WBC 5.4  HGB 13.2  HCT 42.3  PLT 123*    Chemistries  Recent Labs  Lab 12/28/17 0727 12/29/17 0428  NA 140 140  K 5.4* 4.3  CL 105 109  CO2 28 26  GLUCOSE 93 138*  BUN 14 19  CREATININE 1.03* 0.92  CALCIUM 9.2 8.9  MG 1.8  --   AST 50*  --   ALT 59*  --   ALKPHOS 65  --   BILITOT 0.5  --      Microbiology Results  Results for orders placed or performed during the hospital encounter of 12/28/17  Urine culture     Status: Abnormal   Collection Time: 12/28/17  5:22 AM  Result Value Ref Range Status   Specimen Description   Final    URINE,  CATHETERIZED Performed at [facility] [address] [place], [place] [address]    Special Requests   Final    NONE Performed at [facility], [address] [place], [place] [address]    Culture >=100,000 COLONIES/mL CITROBACTER KOSERI (A)  Final   Report Status 12/30/2017 FINAL  Final   Organism ID, Bacteria CITROBACTER KOSERI (A)  Final      Susceptibility   Citrobacter koseri - MIC*    CEFAZOLIN <=4 SENSITIVE Sensitive     CEFTRIAXONE <=1 SENSITIVE Sensitive     CIPROFLOXACIN <=0.25 SENSITIVE Sensitive     GENTAMICIN <=1 SENSITIVE Sensitive     IMIPENEM <=0.25 SENSITIVE Sensitive     NITROFURANTOIN <=16 SENSITIVE Sensitive     TRIMETH/SULFA <=20 SENSITIVE Sensitive     PIP/TAZO <=4 SENSITIVE Sensitive     * >=100,000 COLONIES/mL CITROBACTER KOSERI    RADIOLOGY:  No results found.   Management plans discussed with the patient, family and they are in agreement.  CODE STATUS: Full Code   TOTAL TIME TAKING CARE OF THIS PATIENT: 32 minutes.      Shaune Pollack M.D on 12/30/2017 at 12:59 PM  Between 7am to 6pm - Pager - [phone]  After 6pm go to [email or web address] - Social research officer, government  [facility]  Office  [phone]  CC: Primary care physician; Leim Fabry, MD   Note: This dictation was prepared with Dragon dictation along with smaller phrase technology. Any transcriptional errors that result from this process are unintentional.

## 2017-12-30 NOTE — Care Management Important Message (Signed)
Important Message  Patient Details  Name: Wendy ConstableMary I Franson MRN: [id number] Date of Birth: 1946-01-16   Medicare Important Message Given:  N/A - LOS <3 / Initial given by admissions    Eber HongGreene, Rakeb Kibble R, RN 12/30/2017, 12:31 PM

## 2017-12-30 NOTE — Discharge Instructions (Addendum)
Resume HHPT Fall precaution.

## 2017-12-31 ENCOUNTER — Encounter: Payer: Self-pay | Admitting: Intensive Care

## 2017-12-31 ENCOUNTER — Emergency Department: Payer: Medicare Other

## 2017-12-31 ENCOUNTER — Inpatient Hospital Stay
Admission: EM | Admit: 2017-12-31 | Discharge: 2018-01-02 | DRG: 071 | Disposition: A | Discharge status: Skilled Nursing Facility | Payer: Medicare Other | Attending: Internal Medicine | Admitting: Internal Medicine

## 2017-12-31 ENCOUNTER — Other Ambulatory Visit: Payer: Self-pay

## 2017-12-31 DIAGNOSIS — G40909 Epilepsy, unspecified, not intractable, without status epilepticus: Secondary | ICD-10-CM | POA: Diagnosis present

## 2017-12-31 DIAGNOSIS — G309 Alzheimer's disease, unspecified: Secondary | ICD-10-CM | POA: Diagnosis not present

## 2017-12-31 DIAGNOSIS — N390 Urinary tract infection, site not specified: Secondary | ICD-10-CM | POA: Diagnosis not present

## 2017-12-31 DIAGNOSIS — G9341 Metabolic encephalopathy: Principal | ICD-10-CM | POA: Diagnosis present

## 2017-12-31 DIAGNOSIS — E785 Hyperlipidemia, unspecified: Secondary | ICD-10-CM | POA: Diagnosis present

## 2017-12-31 DIAGNOSIS — Z8673 Personal history of transient ischemic attack (TIA), and cerebral infarction without residual deficits: Secondary | ICD-10-CM

## 2017-12-31 DIAGNOSIS — Z882 Allergy status to sulfonamides status: Secondary | ICD-10-CM

## 2017-12-31 DIAGNOSIS — E119 Type 2 diabetes mellitus without complications: Secondary | ICD-10-CM | POA: Diagnosis not present

## 2017-12-31 DIAGNOSIS — Z9660 Presence of unspecified orthopedic joint implant: Secondary | ICD-10-CM | POA: Diagnosis not present

## 2017-12-31 DIAGNOSIS — K219 Gastro-esophageal reflux disease without esophagitis: Secondary | ICD-10-CM | POA: Diagnosis present

## 2017-12-31 DIAGNOSIS — E871 Hypo-osmolality and hyponatremia: Secondary | ICD-10-CM | POA: Diagnosis present

## 2017-12-31 DIAGNOSIS — I480 Paroxysmal atrial fibrillation: Secondary | ICD-10-CM | POA: Diagnosis present

## 2017-12-31 DIAGNOSIS — B9689 Other specified bacterial agents as the cause of diseases classified elsewhere: Secondary | ICD-10-CM | POA: Diagnosis present

## 2017-12-31 DIAGNOSIS — Z7901 Long term (current) use of anticoagulants: Secondary | ICD-10-CM

## 2017-12-31 DIAGNOSIS — Z794 Long term (current) use of insulin: Secondary | ICD-10-CM

## 2017-12-31 DIAGNOSIS — Z79899 Other long term (current) drug therapy: Secondary | ICD-10-CM

## 2017-12-31 DIAGNOSIS — Z792 Long term (current) use of antibiotics: Secondary | ICD-10-CM | POA: Diagnosis not present

## 2017-12-31 DIAGNOSIS — F0281 Dementia in other diseases classified elsewhere with behavioral disturbance: Secondary | ICD-10-CM | POA: Diagnosis present

## 2017-12-31 DIAGNOSIS — R29706 NIHSS score 6: Secondary | ICD-10-CM | POA: Diagnosis present

## 2017-12-31 DIAGNOSIS — R197 Diarrhea, unspecified: Secondary | ICD-10-CM | POA: Diagnosis not present

## 2017-12-31 DIAGNOSIS — I10 Essential (primary) hypertension: Secondary | ICD-10-CM | POA: Diagnosis present

## 2017-12-31 DIAGNOSIS — R4182 Altered mental status, unspecified: Secondary | ICD-10-CM | POA: Diagnosis present

## 2017-12-31 DIAGNOSIS — R402413 Glasgow coma scale score 13-15, at hospital admission: Secondary | ICD-10-CM | POA: Diagnosis present

## 2017-12-31 DIAGNOSIS — F329 Major depressive disorder, single episode, unspecified: Secondary | ICD-10-CM | POA: Diagnosis not present

## 2017-12-31 LAB — COMPREHENSIVE METABOLIC PANEL
ALK PHOS: 73 U/L (ref 38–126)
ALT: 68 U/L — AB (ref 14–54)
AST: 63 U/L — ABNORMAL HIGH (ref 15–41)
Albumin: 3.5 g/dL (ref 3.5–5.0)
Anion gap: 9 (ref 5–15)
BILIRUBIN TOTAL: 0.7 mg/dL (ref 0.3–1.2)
BUN: 11 mg/dL (ref 6–20)
CALCIUM: 9.2 mg/dL (ref 8.9–10.3)
CHLORIDE: 105 mmol/L (ref 101–111)
CO2: 26 mmol/L (ref 22–32)
CREATININE: 0.75 mg/dL (ref 0.44–1.00)
Glucose, Bld: 154 mg/dL — ABNORMAL HIGH (ref 65–99)
Potassium: 3.5 mmol/L (ref 3.5–5.1)
Sodium: 140 mmol/L (ref 135–145)
TOTAL PROTEIN: 7.1 g/dL (ref 6.5–8.1)

## 2017-12-31 LAB — DIFFERENTIAL
Basophils Absolute: 0 10*3/uL (ref 0–0.1)
Basophils Relative: 1 %
Eosinophils Absolute: 0 10*3/uL (ref 0–0.7)
Eosinophils Relative: 0 %
Lymphocytes Relative: 22 %
Lymphs Abs: 1.4 10*3/uL (ref 1.0–3.6)
MONO ABS: 0.6 10*3/uL (ref 0.2–0.9)
MONOS PCT: 10 %
NEUTROS ABS: 4.1 10*3/uL (ref 1.4–6.5)
Neutrophils Relative %: 67 %

## 2017-12-31 LAB — URINALYSIS, COMPLETE (UACMP) WITH MICROSCOPIC
Bacteria, UA: NONE SEEN
Bilirubin Urine: NEGATIVE
GLUCOSE, UA: NEGATIVE mg/dL
Ketones, ur: NEGATIVE mg/dL
Leukocytes, UA: NEGATIVE
Nitrite: NEGATIVE
PH: 6 (ref 5.0–8.0)
Protein, ur: 100 mg/dL — AB
Specific Gravity, Urine: 1.013 (ref 1.005–1.030)

## 2017-12-31 LAB — CBC
HEMATOCRIT: 47.6 % — AB (ref 35.0–47.0)
Hemoglobin: 14.9 g/dL (ref 12.0–16.0)
MCH: 21.6 pg — ABNORMAL LOW (ref 26.0–34.0)
MCHC: 31.4 g/dL — ABNORMAL LOW (ref 32.0–36.0)
MCV: 68.6 fL — AB (ref 80.0–100.0)
Platelets: 129 10*3/uL — ABNORMAL LOW (ref 150–440)
RBC: 6.93 MIL/uL — ABNORMAL HIGH (ref 3.80–5.20)
RDW: 15.3 % — AB (ref 11.5–14.5)
WBC: 6.2 10*3/uL (ref 3.6–11.0)

## 2017-12-31 LAB — APTT: aPTT: 34 seconds (ref 24–36)

## 2017-12-31 LAB — TROPONIN I

## 2017-12-31 LAB — PROTIME-INR
INR: 1.4
Prothrombin Time: 17 seconds — ABNORMAL HIGH (ref 11.4–15.2)

## 2017-12-31 LAB — GLUCOSE, CAPILLARY: Glucose-Capillary: 163 mg/dL — ABNORMAL HIGH (ref 65–99)

## 2017-12-31 LAB — AMMONIA: Ammonia: 33 umol/L (ref 9–35)

## 2017-12-31 MED ORDER — RIVAROXABAN 20 MG PO TABS
20.0000 mg | ORAL_TABLET | Freq: Every day | ORAL | Status: DC
  Administered 2018-01-01 – 2018-01-02 (×2): 20 mg via ORAL
  Filled 2017-12-31 (×2): qty 1

## 2017-12-31 MED ORDER — VENLAFAXINE HCL 37.5 MG PO TABS
37.5000 mg | ORAL_TABLET | Freq: Two times a day (BID) | ORAL | Status: DC
  Administered 2018-01-01 – 2018-01-02 (×3): 37.5 mg via ORAL
  Filled 2017-12-31 (×5): qty 1

## 2017-12-31 MED ORDER — ONDANSETRON HCL 4 MG PO TABS: 4.0000 mg | ORAL_TABLET | Freq: Four times a day (QID) | ORAL | Status: DC | PRN

## 2017-12-31 MED ORDER — MAGNESIUM OXIDE 400 (241.3 MG) MG PO TABS
400.0000 mg | ORAL_TABLET | Freq: Two times a day (BID) | ORAL | Status: DC
  Administered 2018-01-01 – 2018-01-02 (×3): 400 mg via ORAL
  Filled 2017-12-31 (×4): qty 1

## 2017-12-31 MED ORDER — FAMOTIDINE 20 MG PO TABS
10.0000 mg | ORAL_TABLET | Freq: Every day | ORAL | Status: DC
  Administered 2018-01-01: 10 mg via ORAL
  Filled 2017-12-31 (×2): qty 1

## 2017-12-31 MED ORDER — ATORVASTATIN CALCIUM 20 MG PO TABS
20.0000 mg | ORAL_TABLET | Freq: Every day | ORAL | Status: DC
  Administered 2018-01-01: 20 mg via ORAL
  Filled 2017-12-31: qty 1

## 2017-12-31 MED ORDER — METOPROLOL SUCCINATE ER 25 MG PO TB24
25.0000 mg | ORAL_TABLET | Freq: Every day | ORAL | Status: DC
  Administered 2018-01-01: 25 mg via ORAL
  Filled 2017-12-31 (×2): qty 1

## 2017-12-31 MED ORDER — ACETAMINOPHEN 650 MG RE SUPP: 650.0000 mg | Freq: Four times a day (QID) | RECTAL | Status: DC | PRN

## 2017-12-31 MED ORDER — SODIUM CHLORIDE 0.9 % IV SOLN
INTRAVENOUS | Status: DC
  Administered 2017-12-31 – 2018-01-01 (×2): via INTRAVENOUS

## 2017-12-31 MED ORDER — SENNOSIDES-DOCUSATE SODIUM 8.6-50 MG PO TABS
1.0000 | ORAL_TABLET | Freq: Every evening | ORAL | Status: DC | PRN
  Administered 2018-01-01: 1 via ORAL
  Filled 2017-12-31: qty 1

## 2017-12-31 MED ORDER — VITAMIN B-12 1000 MCG PO TABS
1000.0000 ug | ORAL_TABLET | Freq: Every day | ORAL | Status: DC
  Administered 2018-01-01 – 2018-01-02 (×2): 1000 ug via ORAL
  Filled 2017-12-31 (×2): qty 1

## 2017-12-31 MED ORDER — SODIUM CHLORIDE 0.9 % IV SOLN
1.0000 g | INTRAVENOUS | Status: DC
  Administered 2017-12-31: 1 g via INTRAVENOUS
  Filled 2017-12-31 (×2): qty 10

## 2017-12-31 MED ORDER — ACETAMINOPHEN 325 MG PO TABS
650.0000 mg | ORAL_TABLET | Freq: Four times a day (QID) | ORAL | Status: DC | PRN
  Filled 2017-12-31: qty 2

## 2017-12-31 MED ORDER — SODIUM CHLORIDE 0.9 % IV SOLN: 1.0000 g | Freq: Once | INTRAVENOUS | Status: DC

## 2017-12-31 MED ORDER — DONEPEZIL HCL 5 MG PO TABS
10.0000 mg | ORAL_TABLET | Freq: Every day | ORAL | Status: DC
  Administered 2018-01-01: 10 mg via ORAL
  Filled 2017-12-31: qty 2
  Filled 2017-12-31: qty 1
  Filled 2017-12-31: qty 2

## 2017-12-31 MED ORDER — AMLODIPINE BESYLATE 5 MG PO TABS
5.0000 mg | ORAL_TABLET | Freq: Every day | ORAL | Status: DC
  Administered 2018-01-01 – 2018-01-02 (×2): 5 mg via ORAL
  Filled 2017-12-31 (×2): qty 1

## 2017-12-31 MED ORDER — CHOLECALCIFEROL 10 MCG (400 UNIT) PO TABS
400.0000 [IU] | ORAL_TABLET | Freq: Every day | ORAL | Status: DC
  Administered 2018-01-01 – 2018-01-02 (×2): 400 [IU] via ORAL
  Filled 2017-12-31 (×3): qty 1

## 2017-12-31 MED ORDER — ONDANSETRON HCL 4 MG/2ML IJ SOLN: 4.0000 mg | Freq: Four times a day (QID) | INTRAMUSCULAR | Status: DC | PRN

## 2017-12-31 NOTE — Plan of Care (Signed)
  Not Progressing Education: Knowledge of disease or condition will improve 12/31/2017 2253 - Not Progressing by Dorna LeitzNesbitt, Cynia Abruzzo M, RN Note Patient has history of dementia. Does not recall education.    Progressing Safety: Ability to remain free from injury will improve 12/31/2017 2253 - Progressing by Dorna LeitzNesbitt, Maeryn Mcgath M, RN

## 2017-12-31 NOTE — Progress Notes (Signed)
Pharmacy Antibiotic Note  Wendy [name] is a 72 y.o. femaVenetia Constablele admitted on 12/31/2017 with UTI.  Pharmacy has been consulted for Ceftriaxone dosing.  Urine Cx from 2/23 show Citrobacter koseri.  Plan: Will start Ceftriaxone 1g Q24H.   Height: 5\' 1"  (154.9 cm) Weight: 164 lb (74.4 kg) IBW/kg (Calculated) : 47.8  Temp (24hrs), Avg:98.3 F (36.8 C), Min:98 F (36.7 C), Max:98.7 F (37.1 C)  Recent Labs  Lab 12/28/17 0522 12/28/17 0727 12/29/17 0428 12/31/17 1541  WBC 4.3  --  5.4 6.2  CREATININE  --  1.03* 0.92 0.75    Estimated Creatinine Clearance: 61.2 mL/min (by C-G formula based on SCr of 0.75 mg/dL).    Allergies  Allergen Reactions  . Aspirin     Antimicrobials this admission: Ceftriaxone 2/23 >>  Dose adjustments this admission:   Microbiology results: UCx 2/26 >> sent    Thank you for allowing pharmacy to be a part of this patient's care.  Carolynne EdouardHannah C Lifsey 12/31/2017 7:29 PM

## 2017-12-31 NOTE — H&P (Signed)
[facility] - [place] at [facility]   PATIENT NAME: Wendy [name]    MR#:  [id number]  DATE OF BIRTH:  [birth year]  DATE OF ADMISSION:  12/31/2017  PRIMARY CARE PHYSICIAN: Leim Fabry, MD   REQUESTING/REFERRING PHYSICIAN:   CHIEF COMPLAINT:   Chief Complaint  Patient presents with  . Weakness  . Emesis  . Altered Mental Status    HISTORY OF PRESENT ILLNESS: Wendy [name]  is a 72 y.o. female with a known history of Alzheimer's dementia, diabetes mellitus, GERD, hyperlipidemia, hypertension, seizure disorder presented to the emergency room for confusion.  Patient was recently discharged from the emergency room after being seen 2 days ago.  She presented with nausea and vomiting and according to family member she was confused this morning.  Patient was not able to get out of the bed she spent all day in the bed today.  She had diarrhea in the bed.  She appeared lethargic and confused.  No witnessed seizure.  Family called the EMS and she was brought to the emergency room.  Initially in the emergency room she was lethargic and confused and she was worked up with CT head which showed no acute abnormality.  She was diagnosed recently with urinary tract infection and was started on oral antibiotic.  In the emergency room patient became more alert and awake and was responding to verbal commands after some time.  Has generalized weakness.  PAST MEDICAL HISTORY:   Past Medical History:  Diagnosis Date  . Alzheimer's dementia   . Depression   . Diabetes mellitus without complication (HCC)   . GERD (gastroesophageal reflux disease)   . Hyperlipidemia   . Hypertension   . Seizure disorder (HCC)   . Stroke (HCC)   . Urinary retention   . UTI (urinary tract infection)     PAST SURGICAL HISTORY:  Past Surgical History:  Procedure Laterality Date  . ABDOMINAL HYSTERECTOMY    . APPENDECTOMY    . HEMIARTHROPLASTY SHOULDER FRACTURE    . JOINT REPLACEMENT      SOCIAL  HISTORY:  Social History   Tobacco Use  . Smoking status: Never Smoker  . Smokeless tobacco: Never Used  Substance Use Topics  . Alcohol use: No    Frequency: Never    FAMILY HISTORY:  Family History  Problem Relation Age of Onset  . CAD Mother   . Diabetes Father   . CAD Sister   . Breast cancer Neg Hx     DRUG ALLERGIES:  Allergies  Allergen Reactions  . Aspirin     REVIEW OF SYSTEMS:  Could not be obtained completely secondary to confusion  MEDICATIONS AT HOME:  Prior to Admission medications   Medication Sig Start Date End Date Taking? Authorizing Provider  amLODipine (NORVASC) 5 MG tablet Take 5 mg by mouth daily.   Yes [provider]  atorvastatin (LIPITOR) 20 MG tablet Take 20 mg by mouth at bedtime.   Yes [provider]  cephALEXin (KEFLEX) 500 MG capsule Take 1 capsule (500 mg total) by mouth every 12 (twelve) hours. 12/30/17  Yes Shaune Pollack, MD  cholecalciferol (VITAMIN D) 400 units TABS tablet Take 400 Units by mouth daily.   Yes [provider]  donepezil (ARICEPT) 10 MG tablet Take 10 mg by mouth at bedtime.   Yes [provider]  insulin aspart (NOVOLOG) 100 UNIT/ML injection Inject 0-6 Units into the skin 3 (three) times daily before meals. SSI: 70-200 --- 0 units,  201-250 -- 2 units, 251-300 -- 4 units, 301-350 --- 6 units, 351+ -- 8 units   Yes [provider]  insulin NPH Human (HUMULIN N,NOVOLIN N) 100 UNIT/ML injection Inject 25 Units into the skin 2 (two) times daily.   Yes [provider]  magnesium oxide (MAG-OX) 400 MG tablet Take 800 mg by mouth daily.   Yes [provider]  metoprolol succinate (TOPROL-XL) 25 MG 24 hr tablet Take 25 mg by mouth at bedtime.   Yes [provider]  ranitidine (ZANTAC) 75 MG tablet Take 75 mg by mouth at bedtime.   Yes [provider]  rivaroxaban (XARELTO) 20 MG TABS tablet Take 20 mg by mouth daily.   Yes [provider]   venlafaxine (EFFEXOR) 37.5 MG tablet Take 37.5 mg by mouth 2 (two) times daily.   Yes [provider]  vitamin B-12 (CYANOCOBALAMIN) 1000 MCG tablet Take 1,000 mcg by mouth daily.   Yes [provider]      PHYSICAL EXAMINATION:   VITAL SIGNS: Blood pressure (!) 145/86, pulse 66, temperature 98 F (36.7 C), resp. rate 13, SpO2 95 %.  GENERAL:  72 y.o.-year-old patient lying in the bed with no acute distress.  EYES: Pupils equal, round, reactive to light and accommodation. No scleral icterus. Extraocular muscles intact.  HEENT: Head atraumatic, normocephalic. Oropharynx dry and nasopharynx clear.  NECK:  Supple, no jugular venous distention. No thyroid enlargement, no tenderness.  LUNGS: Normal breath sounds bilaterally, no wheezing, rales,rhonchi or crepitation. No use of accessory muscles of respiration.  CARDIOVASCULAR: S1, S2 normal. No murmurs, rubs, or gallops.  ABDOMEN: Soft, nontender, nondistended. Bowel sounds present. No organomegaly or mass.  EXTREMITIES: No pedal edema, cyanosis, or clubbing.  NEUROLOGIC: Not completely oriented to time, place and person Moves all extremities. Sensation intact. Gait not checked.  PSYCHIATRIC: The patient is alert and oriented x 1 SKIN: No obvious rash, lesion, or ulcer.   LABORATORY PANEL:   CBC Recent Labs  Lab 12/28/17 0522 12/29/17 0428 12/31/17 1541  WBC 4.3 5.4 6.2  HGB 10.7* 13.2 14.9  HCT 34.6* 42.3 47.6*  PLT 109* 123* 129*  MCV 70.7* 69.2* 68.6*  MCH 21.7* 21.6* 21.6*  MCHC 30.8* 31.2* 31.4*  RDW 16.4* 15.3* 15.3*  LYMPHSABS 1.5  --  1.4  MONOABS 0.5  --  0.6  EOSABS 0.0  --  0.0  BASOSABS 0.0  --  0.0   ------------------------------------------------------------------------------------------------------------------  Chemistries  Recent Labs  Lab 12/28/17 0727 12/29/17 0428 12/31/17 1541  NA 140 140 140  K 5.4* 4.3 3.5  CL 105 109 105  CO2 28 26 26   GLUCOSE 93 138* 154*  BUN 14 19 11    CREATININE 1.03* 0.92 0.75  CALCIUM 9.2 8.9 9.2  MG 1.8  --   --   AST 50*  --  63*  ALT 59*  --  68*  ALKPHOS 65  --  73  BILITOT 0.5  --  0.7   ------------------------------------------------------------------------------------------------------------------ estimated creatinine clearance is 61 mL/min (by C-G formula based on SCr of 0.75 mg/dL). ------------------------------------------------------------------------------------------------------------------ No results for input(s): TSH, T4TOTAL, T3FREE, THYROIDAB in the last 72 hours.  Invalid input(s): FREET3   Coagulation profile Recent Labs  Lab 12/31/17 1541  INR 1.40   ------------------------------------------------------------------------------------------------------------------- No results for input(s): DDIMER in the last 72 hours. -------------------------------------------------------------------------------------------------------------------  Cardiac Enzymes Recent Labs  Lab 12/28/17 0727 12/31/17 1541  TROPONINI <0.03 <0.03   ------------------------------------------------------------------------------------------------------------------ Invalid input(s): POCBNP  ---------------------------------------------------------------------------------------------------------------  Urinalysis  Component Value Date/Time   COLORURINE YELLOW (A) 12/31/2017 1542   APPEARANCEUR CLEAR (A) 12/31/2017 1542   APPEARANCEUR Clear 02/05/2013 1758   LABSPEC 1.013 12/31/2017 1542   LABSPEC 1.016 02/05/2013 1758   PHURINE 6.0 12/31/2017 1542   GLUCOSEU NEGATIVE 12/31/2017 1542   GLUCOSEU 150 mg/dL 40/98/1191 4782   HGBUR SMALL (A) 12/31/2017 1542   BILIRUBINUR NEGATIVE 12/31/2017 1542   BILIRUBINUR Negative 02/05/2013 1758   KETONESUR NEGATIVE 12/31/2017 1542   PROTEINUR 100 (A) 12/31/2017 1542   NITRITE NEGATIVE 12/31/2017 1542   LEUKOCYTESUR NEGATIVE 12/31/2017 1542   LEUKOCYTESUR Trace 02/05/2013 1758      RADIOLOGY: Dg Chest 2 View  Result Date: 12/31/2017 CLINICAL DATA:  Weakness. EXAM: CHEST  2 VIEW COMPARISON:  Chest x-ray dated December 28, 2017. FINDINGS: The heart size and mediastinal contours are within normal limits. Normal pulmonary vascularity. Low lung volumes. Mild bibasilar atelectasis. No focal consolidation, pleural effusion, or pneumothorax. No acute osseous abnormality. IMPRESSION: Bibasilar atelectasis.  No active cardiopulmonary disease. Electronically Signed   By: Obie Dredge M.D.   On: 12/31/2017 16:44   Ct Head Wo Contrast  Result Date: 12/31/2017 CLINICAL DATA:  Altered mental status.  Weakness. EXAM: CT HEAD WITHOUT CONTRAST TECHNIQUE: Contiguous axial images were obtained from the base of the skull through the vertex without intravenous contrast. COMPARISON:  MRI brain and CT head dated December 28, 2017. FINDINGS: Brain: No evidence of acute infarction, hemorrhage, hydrocephalus, extra-axial collection or mass lesion/mass effect. Unchanged mild cerebral atrophy and moderate chronic microvascular ischemic changes. Stable atrophy of the brainstem with unchanged tiny calcification in the pons. Old right basal ganglia infarct again noted. Vascular: Calcified atherosclerosis at the skullbase. No hyperdense vessel. Skull: Normal. Negative for fracture or focal lesion. Sinuses/Orbits: No acute finding. Other: None. IMPRESSION: 1.  No acute intracranial abnormality. 2. Unchanged chronic infarcts, mild cerebral atrophy, and moderate chronic microvascular ischemic changes. Electronically Signed   By: Obie Dredge M.D.   On: 12/31/2017 16:34    EKG: Orders placed or performed during the hospital encounter of 12/31/17  . ED EKG  . ED EKG    IMPRESSION AND PLAN: 72 year old female patient with history of Alzheimer dementia, seizure disorder, diabetes mellitus, hypertension, hyperlipidemia presented to the emergency room with confusion.  Admitting diagnosis 1.  Altered  mental status 2.  Citrobacter urinary tract infection 3 Alzheimer's dementia with behavioral problems. 4.  Hypertension 5 hyperlipidemia. 6 diabetes mellitus type 2. Treatment plan Admit patient to medical floor IV fluid hydration Start patient on IV Rocephin antibiotic 1 g daily Diabetic diet DVT prophylaxis subcu Lovenox 40 daily Resume home medications  All the records are reviewed and case discussed with ED provider. Management plans discussed with the patient, family and they are in agreement.  CODE STATUS:FULL CODE Code Status History    Date Active Date Inactive Code Status Order ID Comments User Context   12/28/2017 09:23 12/30/2017 16:47 Full Code [id number]  Shaune Pollack, MD Inpatient    Advance Directive Documentation     Most Recent Value  Type of Advance Directive  Living will  Pre-existing out of facility DNR order (yellow form or pink MOST form)  No data  "MOST" Form in Place?  No data       TOTAL TIME TAKING CARE OF THIS PATIENT: 52 minutes.    Ihor Austin M.D on 12/31/2017 at 5:36 PM  Between 7am to 6pm - Pager - [phone]  After 6pm go to [email or web address] - password [email or web address]  [facility]  Office  [phone]  CC: Primary care physician; Leim Fabry, MD

## 2017-12-31 NOTE — Progress Notes (Signed)
Received patient from ED,alert and oriented x4,vital signs within normal limits

## 2017-12-31 NOTE — ED Notes (Signed)
Patient transported to CT 

## 2017-12-31 NOTE — ED Triage Notes (Addendum)
Patient is here today for weakness and emesis. Last stroke 2 years ago. Has had multiple mini strokes since. Patients husband reports this morning around 0800 he found patient in urine and stool this AM and was weak and could not ambulate. Patient is normally able to ambulate baseline. Patient is very lethargic in triage

## 2017-12-31 NOTE — ED Notes (Signed)
Attempted report; was told nurse would call back. 

## 2017-12-31 NOTE — ED Provider Notes (Addendum)
[facility] Emergency Department Provider Note  ____________________________________________   I have reviewed the triage vital signs and the nursing notes. Where available I have reviewed prior notes and, if possible and indicated, outside hospital notes.    HISTORY  Chief Complaint Weakness; Emesis; and Altered Mental Status    HPI Wendy [name] is a 72 y.o. female with a history of Alzheimer's or dementia, seizure disorder which is reported diabetes mellitus hypertension hyperlipidemia, recent admission and discharge yesterday for UTI, history of mini strokes, was on blood thinner but not taking any seizure medication as there seems to be some question as to whether she has seizures, presents today with altered mental status.  According to husband she was fine when she went home, and then this morning, she did not get out of bed she spent the day in bed.  She did have emesis and diarrhea in bed.  She has had no fever or chills.  She seemed lethargic and somewhat confused him.  This is similar to prior presentations.  He did not directly witness any seizure activity.  Unclear why he waited till this afternoon to call for ambulance.  Patient herself has no complaints.  She states she feels somewhat weak.   Level 5 chart caveat; no further history available due to patient status.  In addition most history is from her husband who has been with her today.  She does have a urinary tract infection recently diagnosed she did take a pill this morning apparently, her culture showed a pansensitive bacterium did have also one episode of vomiting today one episode of diarrhea in the bed.    Past Medical History:  Diagnosis Date  . Alzheimer's dementia   . Depression   . Diabetes mellitus without complication (HCC)   . GERD (gastroesophageal reflux disease)   . Hyperlipidemia   . Hypertension   . Seizure disorder (HCC)   . Stroke (HCC)   . Urinary retention   . UTI (urinary  tract infection)     Patient Active Problem List   Diagnosis Date Noted  . Acute metabolic encephalopathy 12/28/2017  . DIABETES MELLITUS, TYPE II 01/18/2011  . LABYRINTHITIS, ACUTE 01/18/2011  . HYPERTENSION 01/18/2011    Past Surgical History:  Procedure Laterality Date  . ABDOMINAL HYSTERECTOMY    . APPENDECTOMY    . HEMIARTHROPLASTY SHOULDER FRACTURE    . JOINT REPLACEMENT      Prior to Admission medications   Medication Sig Start Date End Date Taking? Authorizing Provider  amLODipine (NORVASC) 5 MG tablet Take 5 mg by mouth daily.    [provider]  atorvastatin (LIPITOR) 20 MG tablet Take 20 mg by mouth at bedtime.    [provider]  cephALEXin (KEFLEX) 500 MG capsule Take 1 capsule (500 mg total) by mouth every 12 (twelve) hours. 12/30/17   Shaune Pollackhen, Qing, MD  cholecalciferol (VITAMIN D) 400 units TABS tablet Take 400 Units by mouth daily.    [provider]  donepezil (ARICEPT) 10 MG tablet Take 10 mg by mouth at bedtime.    [provider]  insulin aspart (NOVOLOG) 100 UNIT/ML injection Inject 0-6 Units into the skin 3 (three) times daily before meals. SSI: 70-200 --- 0 units, 201-250 -- 2 units, 251-300 -- 4 units, 301-350 --- 6 units, 351+ -- 8 units    [provider]  insulin NPH Human (HUMULIN N,NOVOLIN N) 100 UNIT/ML injection Inject 25 Units into the skin 2 (two) times daily.  [provider]  magnesium oxide (MAG-OX) 400 MG tablet Take 800 mg by mouth daily.    [provider]  metoprolol succinate (TOPROL-XL) 25 MG 24 hr tablet Take 25 mg by mouth at bedtime.    [provider]  ranitidine (ZANTAC) 75 MG tablet Take 75 mg by mouth at bedtime.    [provider]  rivaroxaban (XARELTO) 20 MG TABS tablet Take 20 mg by mouth daily.    [provider]  venlafaxine (EFFEXOR) 37.5 MG tablet Take 37.5 mg by mouth 2 (two) times daily.    [provider]  vitamin B-12  (CYANOCOBALAMIN) 1000 MCG tablet Take 1,000 mcg by mouth daily.    [provider]    Allergies Aspirin  Family History  Problem Relation Age of Onset  . CAD Mother   . Diabetes Father   . CAD Sister   . Breast cancer Neg Hx     Social History Social History   Tobacco Use  . Smoking status: Never Smoker  . Smokeless tobacco: Never Used  Substance Use Topics  . Alcohol use: No    Frequency: Never  . Drug use: No    Review of Systems Constitutional: No fever/chills Eyes: No visual changes. ENT: No sore throat. No stiff neck no neck pain Cardiovascular: Denies chest pain. Respiratory: Denies shortness of breath. Gastrointestinal:   + vomiting.  + diarrhea.  No constipation. Genitourinary: Negative for dysuria. Musculoskeletal: Negative lower extremity swelling Skin: Negative for rash. Neurological: Negative for severe headaches, focal weakness or numbness.   ____________________________________________   PHYSICAL EXAM:  VITAL SIGNS: ED Triage Vitals  Enc Vitals Group     BP 12/31/17 1527 130/82     Pulse Rate 12/31/17 1527 73     Resp 12/31/17 1527 16     Temp 12/31/17 1527 98.7 F (37.1 C)     Temp Source 12/31/17 1527 Oral     SpO2 12/31/17 1527 95 %     Weight --      Height --      Head Circumference --      Peak Flow --      Pain Score 12/31/17 1614 0     Pain Loc --      Pain Edu? --      Excl. in GC? --     Constitutional: She is somewhat inert on the bed but she will talk to me she is alert she will look at her husband and tell me his name she will tell me her name and where she has "some hospital," unclear of the date.  Will follow commands.  Well appearing and in no acute distress. Eyes: Conjunctivae are normal Head: Atraumatic HEENT: No congestion/rhinnorhea. Mucous membranes are moist.  Oropharynx non-erythematous Neck:   Nontender with no meningismus, no masses, no stridor Cardiovascular: Normal rate, regular rhythm. Grossly  normal heart sounds.  Good peripheral circulation. Respiratory: Normal respiratory effort.  No retractions. Lungs CTAB. Abdominal: Soft and nontender. No distention. No guarding no rebound Back:  There is no focal tenderness or step off.  there is no midline tenderness there are no lesions noted. there is no CVA tenderness Musculoskeletal: No lower extremity tenderness, no upper extremity tenderness. No joint effusions, no DVT signs strong distal pulses no edema Neurologic:  Normal speech and language. No gross focal neurologic deficits are appreciated.  Skin:  Skin is warm, dry and intact. No rash noted. Psychiatric: Mood and affect are normal. Speech and behavior  are normal.  ____________________________________________   LABS (all labs ordered are listed, but only abnormal results are displayed)  Labs Reviewed  PROTIME-INR - Abnormal; Notable for the following components:      Result Value   Prothrombin Time 17.0 (*)    All other components within normal limits  CBC - Abnormal; Notable for the following components:   RBC 6.93 (*)    HCT 47.6 (*)    MCV 68.6 (*)    MCH 21.6 (*)    MCHC 31.4 (*)    RDW 15.3 (*)    All other components within normal limits  COMPREHENSIVE METABOLIC PANEL - Abnormal; Notable for the following components:   Glucose, Bld 154 (*)    AST 63 (*)    ALT 68 (*)    All other components within normal limits  GLUCOSE, CAPILLARY - Abnormal; Notable for the following components:   Glucose-Capillary 163 (*)    All other components within normal limits  URINE CULTURE  APTT  DIFFERENTIAL  TROPONIN I  URINALYSIS, COMPLETE (UACMP) WITH MICROSCOPIC  CBG MONITORING, ED    Pertinent labs  results that were available during my care of the patient were reviewed by me and considered in my medical decision making (see chart for details). ____________________________________________  EKG  I personally interpreted any EKGs ordered by me or triage Sinus rhythm,  normal axis, intraventricular conduction delay with accompanying ST changes which is not changed from prior rate 64  ____________________________________________  RADIOLOGY  Pertinent labs & imaging results that were available during my care of the patient were reviewed by me and considered in my medical decision making (see chart for details). If possible, patient and/or family made aware of any abnormal findings.  I personally reviewed x-rays.   ____________________________________________    PROCEDURES  Procedure(s) performed: None  Procedures  Critical Care performed: None  ____________________________________________   INITIAL IMPRESSION / ASSESSMENT AND PLAN / ED COURSE  Pertinent labs & imaging results that were available during my care of the patient were reviewed by me and considered in my medical decision making (see chart for details).  Patient here with lethargy, inability to get out of bed, vomiting diarrhea in the bed, and feeling generally unwell and weak today after discharge yesterday for UTI.  This does not seem to similar from prior admissions.  Certainly could be patient has a seizure history that is manifesting itself.  No witnessed seizure however she may have been postictal earlier today.  This is just conjecture, she is not neurologically focal, that is a states she can wiggle her toes and lift her arms and does not seem to have any acute focality although CVA is certainly possible, she is outside the window for TPA if that is the diagnosis.  Unclear why the patient was doing so poorly at home.  We are instituting a broad workup including blood urine CT head etc. we will continue to assess closely while here.    ____________________________________________   FINAL CLINICAL IMPRESSION(S) / ED DIAGNOSES  Final diagnoses:  None      This chart was dictated using voice recognition software.  Despite best efforts to proofread,  errors can occur which can  change meaning.      Jeanmarie Plant, MD 12/31/17 1643    Jeanmarie Plant, MD 12/31/17 1646    Jeanmarie Plant, MD 12/31/17 [phone]

## 2018-01-01 DIAGNOSIS — R4182 Altered mental status, unspecified: Secondary | ICD-10-CM | POA: Diagnosis not present

## 2018-01-01 DIAGNOSIS — G9341 Metabolic encephalopathy: Secondary | ICD-10-CM | POA: Diagnosis not present

## 2018-01-01 LAB — BASIC METABOLIC PANEL
Anion gap: 8 (ref 5–15)
BUN: 12 mg/dL (ref 6–20)
CALCIUM: 8.8 mg/dL — AB (ref 8.9–10.3)
CO2: 23 mmol/L (ref 22–32)
CREATININE: 0.79 mg/dL (ref 0.44–1.00)
Chloride: 109 mmol/L (ref 101–111)
Glucose, Bld: 143 mg/dL — ABNORMAL HIGH (ref 65–99)
Potassium: 4.7 mmol/L (ref 3.5–5.1)
SODIUM: 140 mmol/L (ref 135–145)

## 2018-01-01 LAB — CBC
HCT: 43.3 % (ref 35.0–47.0)
Hemoglobin: 13.7 g/dL (ref 12.0–16.0)
MCH: 21.7 pg — ABNORMAL LOW (ref 26.0–34.0)
MCHC: 31.5 g/dL — ABNORMAL LOW (ref 32.0–36.0)
MCV: 68.7 fL — ABNORMAL LOW (ref 80.0–100.0)
PLATELETS: 103 10*3/uL — AB (ref 150–440)
RBC: 6.31 MIL/uL — AB (ref 3.80–5.20)
RDW: 15.1 % — ABNORMAL HIGH (ref 11.5–14.5)
WBC: 4.4 10*3/uL (ref 3.6–11.0)

## 2018-01-01 LAB — GLUCOSE, CAPILLARY: Glucose-Capillary: 184 mg/dL — ABNORMAL HIGH (ref 65–99)

## 2018-01-01 MED ORDER — CEFUROXIME AXETIL 250 MG PO TABS: 250.0000 mg | ORAL_TABLET | Freq: Two times a day (BID) | ORAL | 0 refills | Status: AC

## 2018-01-01 MED ORDER — CEFUROXIME AXETIL 500 MG PO TABS
250.0000 mg | ORAL_TABLET | Freq: Two times a day (BID) | ORAL | Status: DC
  Administered 2018-01-01 – 2018-01-02 (×2): 250 mg via ORAL
  Filled 2018-01-01: qty 1
  Filled 2018-01-01 (×2): qty 0.5

## 2018-01-01 MED ORDER — POLYETHYLENE GLYCOL 3350 17 G PO PACK
17.0000 g | PACK | Freq: Every day | ORAL | Status: DC
  Administered 2018-01-01 – 2018-01-02 (×2): 17 g via ORAL
  Filled 2018-01-01 (×2): qty 1

## 2018-01-01 NOTE — [place] FL2 (Signed)
[place] MEDICAID FL2 LEVEL OF CARE SCREENING TOOL     IDENTIFICATION  Patient Name: Wendy [name] Birthdate: November 05, 1946 Sex: female Admission Date (Current Location): 12/31/2017  [place]   Facility and Address:  [facility], [address], [place], [place] [address]      Provider Number: [id number]  Attending Physician Name and Address:  Adrian SaranMody, Sital, MD  Relative Name and Phone Number:  [facility],Larry Spouse [phone] or Jill PolingBURNETTE,SHELIA D Daughter [phone]     Current Level of Care: Hospital Recommended Level of Care: Skilled Nursing Facility Prior Approval Number:    Date Approved/Denied:   PASRR Number: [id number]  Discharge Plan: SNF    Current Diagnoses: Patient Active Problem List   Diagnosis Date Noted  . Altered mental status 12/31/2017  . Acute metabolic encephalopathy 12/28/2017  . DIABETES MELLITUS, TYPE II 01/18/2011  . LABYRINTHITIS, ACUTE 01/18/2011  . HYPERTENSION 01/18/2011    Orientation RESPIRATION BLADDER Height & Weight     Self, Time, Place  Normal Continent Weight: 164 lb (74.4 kg) Height:  5\' 1"  (154.9 cm)  BEHAVIORAL SYMPTOMS/MOOD NEUROLOGICAL BOWEL NUTRITION STATUS      Incontinent Diet(Carb Modifed Cardiac diet)  AMBULATORY STATUS COMMUNICATION OF NEEDS Skin   Limited Assist Verbally Normal                       Personal Care Assistance Level of Assistance  Bathing, Feeding, Dressing Bathing Assistance: Limited assistance Feeding assistance: Independent Dressing Assistance: Limited assistance     Functional Limitations Info  Sight, Hearing, Speech Sight Info: Adequate Hearing Info: Adequate Speech Info: Adequate    SPECIAL CARE FACTORS FREQUENCY  PT (By licensed PT)     PT Frequency: 5x a week              Contractures      Additional Factors Info  Code Status, Allergies, Psychotropic Code Status Info: Full Code Allergies Info:  Aspirin Psychotropic Info: venlafaxine (EFFEXOR) tablet 37.5 mg          Current Medications (01/01/2018):  This is the current hospital active medication list Current Facility-Administered Medications  Medication Dose Route Frequency Provider Last Rate Last Dose  . 0.9 %  sodium chloride infusion   Intravenous Continuous Ihor AustinPyreddy, Pavan, MD 75 mL/hr at 12/31/17 1931    . acetaminophen (TYLENOL) tablet 650 mg  650 mg Oral Q6H PRN Ihor AustinPyreddy, Pavan, MD       Or  . acetaminophen (TYLENOL) suppository 650 mg  650 mg Rectal Q6H PRN Pyreddy, Vivien RotaPavan, MD      . amLODipine (NORVASC) tablet 5 mg  5 mg Oral Daily Pyreddy, Pavan, MD   5 mg at 01/01/18 0900  . atorvastatin (LIPITOR) tablet 20 mg  20 mg Oral QHS Pyreddy, Pavan, MD      . cefUROXime (CEFTIN) tablet 250 mg  250 mg Oral BID WC Adrian SaranMody, Sital, MD   250 mg at 01/01/18 1702  . cholecalciferol (VITAMIN D) tablet 400 Units  400 Units Oral Daily Ihor AustinPyreddy, Pavan, MD   400 Units at 01/01/18 0900  . donepezil (ARICEPT) tablet 10 mg  10 mg Oral Lamont SnowballQHS Willis, David, MD      . famotidine (PEPCID) tablet 10 mg  10 mg Oral Lamont SnowballQHS Willis, David, MD      . magnesium oxide (MAG-OX) tablet 400 mg  400 mg Oral BID Oralia ManisWillis, David, MD   400 mg at 01/01/18 0900  . metoprolol succinate (  TOPROL-XL) 24 hr tablet 25 mg  25 mg Oral QHS Oralia Manis, MD      . ondansetron [facility]) tablet 4 mg  4 mg Oral Q6H PRN Ihor Austin, MD       Or  . ondansetron (ZOFRAN) injection 4 mg  4 mg Intravenous Q6H PRN Pyreddy, Pavan, MD      . polyethylene glycol (MIRALAX / GLYCOLAX) packet 17 g  17 g Oral Daily Mody, Sital, MD   17 g at 01/01/18 1158  . rivaroxaban (XARELTO) tablet 20 mg  20 mg Oral Daily Pyreddy, Pavan, MD   20 mg at 01/01/18 0900  . senna-docusate (Senokot-S) tablet 1 tablet  1 tablet Oral QHS PRN Ihor Austin, MD   1 tablet at 01/01/18 1158  . venlafaxine (EFFEXOR) tablet 37.5 mg  37.5 mg Oral BID Pyreddy, Vivien Rota, MD   37.5 mg at 01/01/18 0900  . vitamin B-12  (CYANOCOBALAMIN) tablet 1,000 mcg  1,000 mcg Oral Daily Pyreddy, Vivien Rota, MD   1,000 mcg at 01/01/18 0900     Discharge Medications: Please see discharge summary for a list of discharge medications.  Relevant Imaging Results:  Relevant Lab Results:   Additional Information SSN [id number]  Darleene Cleaver, [place]

## 2018-01-01 NOTE — Evaluation (Signed)
Physical Therapy Evaluation Patient Details Name: Wendy [name] MRN: [id number] DOB: [birth year] Today's Date: 01/01/2018   History of Present Illness  Pt is a 72 y/o F who was recently discharged from the emergency room after being seen 2 days prior to presentation.  Pt presented with nausea, vomiting, diarrhea, confusion, inability to get OOB.  She was diagnosed recently with urinary tract infection and was started on oral antibiotic.  Admitted for acute encephalopathy due to UTI.  Pt's PMH inculdes Alzheimer's dementia, seizure disorder, stroke, hemiarthroplasty following shoulder fx.     Clinical Impression  Pt admitted with above diagnosis. Pt currently with functional limitations due to the deficits listed below (see PT Problem List). Wendy [name] presents with BLE and BUE weakness.  She currently requires mod assist for supine>sit and mod assist for sit>stand.  Pt takes two steps to initiate ambulation before reporting need to sit due to fatigue. Pt is at a high risk of falling.  Her husband is unable to provide the level of assist the pt currently requires.  Given pt's current mobility status, recommending SNF at d/c.  Pt will benefit from skilled PT to increase their independence and safety with mobility to allow discharge to the venue listed below.      Follow Up Recommendations SNF    Equipment Recommendations  None recommended by PT    Recommendations for Other Services       Precautions / Restrictions Precautions Precautions: Fall Restrictions Weight Bearing Restrictions: No      Mobility  Bed Mobility Overal bed mobility: Needs Assistance Bed Mobility: Supine to Sit;Sit to Supine     Supine to sit: Min assist;HOB elevated Sit to supine: Mod assist   General bed mobility comments: Pt requires increased time and cues to intiate and complete supine>sit.  Assist to elevate trunk.  To return to supine from sit pt requires assist to bring LEs into bed.   Transfers Overall  transfer level: Needs assistance Equipment used: Rolling walker (2 wheeled) Transfers: Sit to/from Stand Sit to Stand: Mod assist         General transfer comment: Pt requires cues for hand placement and assist to boost to standing.  Pt with flexed trunk with improved minimally and temporarily with cues.    Ambulation/Gait Ambulation/Gait assistance: Min assist Ambulation Distance (Feet): 2 Feet Assistive device: Rolling walker (2 wheeled) Gait Pattern/deviations: Shuffle;Trunk flexed     General Gait Details: Pt took 2 steps forward before reporting need to sit due to fatigue with pt moving more into a flexed position.    Stairs            Wheelchair Mobility    Modified Rankin (Stroke Patients Only)       Balance Overall balance assessment: Needs assistance;History of Falls Sitting-balance support: No upper extremity supported;Feet supported Sitting balance-Leahy Scale: Fair     Standing balance support: Bilateral upper extremity supported;During functional activity Standing balance-Leahy Scale: Poor Standing balance comment: Pt relies on UE support and outside physical assist for static and dynamic activity                             Pertinent Vitals/Pain Pain Assessment: Faces Pain Score: 0-No pain(no sign of pain)    Home Living Family/patient expects to be discharged to:: Private residence Living Arrangements: Spouse/significant other Available Help at Discharge: Family;Available 24 hours/day Type of Home: House Home Access: Stairs to enter Entrance Stairs-Rails: Left;Right;Can reach  both Entrance Stairs-Number of Steps: 3 Home Layout: One level Home Equipment: Walker - 4 wheels;Bedside commode;Wheelchair - [employer] - 2 wheels;Hospital bed      Prior Function Level of Independence: Needs assistance   Gait / Transfers Assistance Needed: Pt ambualtes limited household distances with RW.  Has had 2 falls in the past 6 months.    ADL's / Homemaking Assistance Needed: Pt requires assist for bathing, dressing, toileting.  Husband does the cooking, cleaning, driving.         Hand Dominance        Extremity/Trunk Assessment   Upper Extremity Assessment Upper Extremity Assessment: (BUE strength grossly 3-/5)    Lower Extremity Assessment Lower Extremity Assessment: (BLE strength grossly 3/5)    Cervical / Trunk Assessment Cervical / Trunk Assessment: Kyphotic  Communication   Communication: No difficulties  Cognition Arousal/Alertness: Awake/alert Behavior During Therapy: WFL for tasks assessed/performed Overall Cognitive Status: History of cognitive impairments - at baseline                                        General Comments      Exercises     Assessment/Plan    PT Assessment Patient needs continued PT services  PT Problem List Decreased strength;Decreased activity tolerance;Decreased balance;Decreased mobility;Decreased knowledge of use of DME;Decreased safety awareness;Decreased cognition       PT Treatment Interventions DME instruction;Gait training;Stair training;Functional mobility training;Therapeutic activities;Balance training;Therapeutic exercise;Neuromuscular re-education;Cognitive remediation;Patient/family education;Wheelchair mobility training    PT Goals (Current goals can be found in the Care Plan section)  Acute Rehab PT Goals Patient Stated Goal: rehab before home PT Goal Formulation: With patient Time For Goal Achievement: 01/15/18 Potential to Achieve Goals: Good    Frequency Min 2X/week   Barriers to discharge Inaccessible home environment;Decreased caregiver support Steps to enter home and husband unable to provided level of assist the pt currently requires    Co-evaluation               AM-PAC PT "6 Clicks" Daily Activity  Outcome Measure Difficulty turning over in bed (including adjusting bedclothes, sheets and blankets)?:  Unable Difficulty moving from lying on back to sitting on the side of the bed? : Unable Difficulty sitting down on and standing up from a chair with arms (e.g., wheelchair, bedside commode, etc,.)?: Unable Help needed moving to and from a bed to chair (including a wheelchair)?: A Lot Help needed walking in hospital room?: Total Help needed climbing 3-5 steps with a railing? : Total 6 Click Score: 7    End of Session Equipment Utilized During Treatment: Gait belt Activity Tolerance: Patient limited by fatigue Patient left: in bed;with call bell/phone within reach;with bed alarm set;with family/visitor present;with nursing/sitter in room Nurse Communication: Mobility status PT Visit Diagnosis: Muscle weakness (generalized) (M62.81);History of falling (Z91.81);Unsteadiness on feet (R26.81);Other abnormalities of gait and mobility (R26.89);Difficulty in walking, not elsewhere classified (R26.2)    Time: 2025-4270 PT Time Calculation (min) (ACUTE ONLY): 23 min   Charges:     PT Treatments $Therapeutic Activity: 8-22 mins   PT G Codes:        Encarnacion Chu PT, DPT 01/01/2018, 1:02 PM

## 2018-01-01 NOTE — Care Management (Signed)
Contacted by [facility].  Patient was discharged from [facility] on 12/27/2017.  Agency tried to make contact for initial visit and there was no answer of home phone and no way to leave a voicemail message. It appears patient [facility] and discharged 12/30/2017.  Agency did a drive by 2/132/26 and home health nurse advised that patient present back to the hospital.  "It took three people to get her into the car.  Informed [facility] a discharge order is present but physical therapy had not evaluated.  Now that this evaluation is complete, the recommendation is to return to SNF. CSW was notified by primary nurse. If patient discharges home, Have obtained orders for RN PT Aide OT and SW

## 2018-01-01 NOTE — Clinical Social Work Note (Signed)
Clinical Social Work Assessment  Patient Details  Name: Wendy [name] MRN: [id number] Date of Birth: [birth year]  Date of referral:  01/01/18               Reason for consult:  Facility Placement                Permission sought to share information with:  Facility Medical sales representativeContact Representative, Family Supports Permission granted to share information::  Yes, Verbal Permission Granted  Name::     [facility],Larry Spouse [phone] or Jill PolingBURNETTE,SHELIA D Daughter [phone]   Agency::  SNF admissions  Relationship::     Contact Information:     Housing/Transportation Living arrangements for the past 2 months:  Single Family Home, Skilled Nursing Facility Source of Information:  Spouse Patient Interpreter Needed:  None Criminal Activity/Legal Involvement Pertinent to Current Situation/Hospitalization:  No - Comment as needed Significant Relationships:  Adult Children, Spouse Lives with:  Spouse Do you feel safe going back to the place where you live?  No Need for family participation in patient care:  Yes (Comment)  Care giving concerns:  Patient's husband feels she needs SNF again for a few days before she is able to return back home.   Social Worker assessment / plan:  Patient is a 72 year old female who is married and lives with her husband.  Patient is alert and oriented x3, patient's husband was at bedside.  Patient's husband reported that she was just at [facility] for rehab and she left last week, because she wanted to go to a funeral of a family member however, she was not able to go, because of her UTI, and having to be readmitted into the hospital.  Patient and her husband requested to return back to [facility] to finish up her therapy.  Patient and family expressed they were pleased with how patient was progressing.  Patient was explained how insurance will pay for stay and what to expect at rehab.  Patient and husband expressed understanding, they did not have any  other questions.  CSW was given permission to send information to [facility] in order to return back to SNF.  Employment status:  Retired Health and safety inspectornsurance information:  Medicare PT Recommendations:  Skilled Nursing Facility Information / Referral to community resources:  Skilled Nursing Facility  Patient/Family's Response to care:  Patient and family are agreeable to going to SNF.  Patient/Family's Understanding of and Emotional Response to Diagnosis, Current Treatment, and Prognosis:  Patient and husband are aware of current diagnosis and treatment plan.  Emotional Assessment Appearance:  Appears stated age Attitude/Demeanor/Rapport:    Affect (typically observed):  Appropriate, Calm, Stable Orientation:  Oriented to Self, Oriented to Place, Oriented to  Time Alcohol / Substance use:  Not Applicable Psych involvement (Current and /or in the community):  No (Comment)  Discharge Needs  Concerns to be addressed:  Lack of Support, Care Coordination Readmission within the last 30 days:  Yes(12-11-17 To [facility]) Current discharge risk:  Cognitively Impaired, Lack of support system Barriers to Discharge:  Continued Medical Work up   [place] [employer], Lynia Landry R, LCSWA 01/01/2018, 7:15 PM

## 2018-01-01 NOTE — Evaluation (Signed)
Clinical/Bedside Swallow Evaluation Patient Details  Name: Wendy [name] MRN: [id number] Date of Birth: [birth year]  Today's Date: 01/01/2018 Time: SLP Start Time (ACUTE ONLY): 1210 SLP Stop Time (ACUTE ONLY): 1300 SLP Time Calculation (min) (ACUTE ONLY): 50 min  Past Medical History:  Past Medical History:  Diagnosis Date  . Alzheimer's dementia   . Depression   . Diabetes mellitus without complication (HCC)   . GERD (gastroesophageal reflux disease)   . Hyperlipidemia   . Hypertension   . Seizure disorder (HCC)   . Stroke (HCC)   . Urinary retention   . UTI (urinary tract infection)    Past Surgical History:  Past Surgical History:  Procedure Laterality Date  . ABDOMINAL HYSTERECTOMY    . APPENDECTOMY    . HEMIARTHROPLASTY SHOULDER FRACTURE    . JOINT REPLACEMENT     HPI:  Pt a 72 y.o. female with a known history of multiple medical problems including Stroke, DM, HTN, Alzheimer's Dementia, seizures.  The patient was just discharged from nursing facility 2 days ago.  She was found confused, weak in the fall last night.  She was sent to ED and was found UTI.  The patient is unresponsive to stimuli.  CAT scan of the head did not show any acute abnormality. Pt is more alert and awake today; husband stated "more like herself". Pt does have Dementia baseline and is less verbal. Husband stated she is "different" when he believes she starts getting a UTI.    Assessment / Plan / Recommendation Clinical Impression  Pt appears to present w/ adequate oropharyngeal phase swallow function w/ no gross oropharyngeal phase dysphagia noted w/ trials at meal/BSE at lunch meal. Pt is at reduced risk for aspiration when following general aspiration precautions. Pt able to feed self w/ setup and positioning. Pt was able to feed self given min support. Pt consumed trials of thin liquids via cup/straw, purees, and soft solids w/ only a min slower oral phase noted for complete oral mastication and clearing  w/ increased textured foods; no deficits noted w/ liquids and purees. No overt s/s of aspiration noted w/ trials as well; clear vocal quality noted b/t trials. Recommend a Mech Soft consistency diet for easier self-feeding and oral phase management; Thin liquids. Recommend general aspiration precautions including Supervision at meals secondary to pt's baseline Cognitive deficits/Dementia. No further skilled ST services indicated at this time; NSG to reconsult if any decline in status while admitted. NSG updated.  SLP Visit Diagnosis: Dysphagia, unspecified (R13.10)    Aspiration Risk  (reduced following general precautions)    Diet Recommendation  Mech Soft(soft, cooked, cut foods) diet consistency w/ Thin liquids; general aspiration precautions. Monitoring and assistance at meals.  Medication Administration: Whole meds with puree(for easier, safer swallowing)    Other  Recommendations Recommended Consults: (Dietician f/u) Oral Care Recommendations: Oral care BID;Staff/trained caregiver to provide oral care Other Recommendations: (n/a)   Follow up Recommendations None      Frequency and Duration (n/a)  (n/a)       Prognosis Prognosis for Safe Diet Advancement: Good Barriers to Reach Goals: Cognitive deficits;Time post onset;Severity of deficits      Swallow Study   General Date of Onset: 12/31/17 HPI: Pt a 72 y.o. female with a known history of multiple medical problems including Stroke, DM, HTN, Alzheimer's Dementia, seizures.  The patient was just discharged from nursing facility 2 days ago.  She was found confused, weak in the fall last night.  She was sent  to ED and was found UTI.  The patient is unresponsive to stimuli.  CAT scan of the head did not show any acute abnormality. Pt is more alert and awake today; husband stated "more like herself". Pt does have Dementia baseline and is less verbal. Husband stated she is "different" when he believes she starts getting a UTI.  Type of  Study: Bedside Swallow Evaluation Previous Swallow Assessment: none reported Diet Prior to this Study: Regular;Thin liquids Temperature Spikes Noted: No(wbc 4.4) Respiratory Status: Room air History of Recent Intubation: No Behavior/Cognition: Alert;Cooperative;Pleasant mood;Confused;Distractible;Requires cueing(baseline Dementia) Oral Cavity Assessment: Within Functional Limits Oral Care Completed by SLP: Recent completion by staff Oral Cavity - Dentition: Missing dentition Vision: Functional for self-feeding Self-Feeding Abilities: Able to feed self;Needs assist;Needs set up(picking up her foods w/ hand to eat as well) Patient Positioning: Upright in bed(needed more upright positioning) Baseline Vocal Quality: (usually just chuckled/laughed) Volitional Cough: Cognitively unable to elicit Volitional Swallow: Unable to elicit    Oral/Motor/Sensory Function Overall Oral Motor/Sensory Function: Within functional limits(appeared WFL for bolus management and at rest)   Ice Chips Ice chips: Not tested   Thin Liquid Thin Liquid: Within functional limits Presentation: Self Fed;Straw(~4 ozs)    Nectar Thick Nectar Thick Liquid: Not tested   Honey Thick Honey Thick Liquid: Not tested   Puree Puree: Within functional limits Presentation: Self Fed;Spoon(3 trials)   Solid   GO   Solid: Within functional limits Presentation: Self Fed;Spoon(fed self; 10+ bites of soft foods)        Jerilynn SomKatherine Renella Steig, MS, CCC-SLP Vedika Dumlao 01/01/2018,2:49 PM

## 2018-01-01 NOTE — Clinical Social Work Note (Signed)
CSW received referral for patient needing SNF.  CSW spoke to patient and her husband, they would like to go to return back to [facility].  [facility] to [facility] and they will review patient's information again and let CSW know if they can accept her back.  CSW faxed updated information to SNF awaiting response back.  According to [facility] they should be able to take her back tomorrow, but will verify and let CSW know.  Formal assessment to follow.  Ervin KnackEric R. Isidro Monks, MSW, Theresia MajorsLCSWA [phone]  01/01/2018 5:45 PM

## 2018-01-01 NOTE — Discharge Summary (Signed)
[facility] [place] at [facility]   PATIENT NAME: Wendy [name]    MR#:  [id number]  DATE OF BIRTH:  27-Mar-1946  DATE OF ADMISSION:  12/31/2017 ADMITTING PHYSICIAN: Ihor Austin, MD  DATE OF DISCHARGE: 01/01/2018  PRIMARY CARE PHYSICIAN: Leim Fabry, MD    ADMISSION DIAGNOSIS:  Altered mental status, unspecified altered mental status type [R41.82]  DISCHARGE DIAGNOSIS:  Active Problems:   Altered mental status UTI citrobacter  SECONDARY DIAGNOSIS:   Past Medical History:  Diagnosis Date  . Alzheimer's dementia   . Depression   . Diabetes mellitus without complication (HCC)   . GERD (gastroesophageal reflux disease)   . Hyperlipidemia   . Hypertension   . Seizure disorder (HCC)   . Stroke (HCC)   . Urinary retention   . UTI (urinary tract infection)     HOSPITAL COURSE:    72 year old female with a history of Alzheimer's dementia and essential hypertension who presents with acute encephalopathy.  1. Acute encephalopathy on dementia: Acute encephalopathy is metabolic in nature due to Citrobacter UTI. Cultures are taken from 3/23. She will be discharged on oral Ceftin. She seems to be at baseline.  2. Diabetes: She will continue outpatient regimen with ADA diet  3. Essential hypertension: Continue metoprolol and Norvasc  4. Hyponatremia: Continue statin  5. Depression: Continue Effexor or  6. History of CVA: Continue Xarelto and Lipitor  7. History of PAF: Continue Xarelto and metoprolol for heart rate control   DISCHARGE CONDITIONS AND DIET:  Stable Diabetic diet  CONSULTS OBTAINED:    DRUG ALLERGIES:   Allergies  Allergen Reactions  . Aspirin     DISCHARGE MEDICATIONS:   Allergies as of 01/01/2018      Reactions   Aspirin       Medication List    STOP taking these medications   cephALEXin 500 MG capsule Commonly known as:  KEFLEX     TAKE these medications   amLODipine 5 MG tablet Commonly known as:   NORVASC Take 5 mg by mouth daily.   atorvastatin 20 MG tablet Commonly known as:  LIPITOR Take 20 mg by mouth at bedtime.   cefUROXime 250 MG tablet Commonly known as:  CEFTIN Take 1 tablet (250 mg total) by mouth 2 (two) times daily with a meal for 6 days.   cholecalciferol 400 units Tabs tablet Commonly known as:  VITAMIN D Take 400 Units by mouth daily.   donepezil 10 MG tablet Commonly known as:  ARICEPT Take 10 mg by mouth at bedtime.   insulin aspart 100 UNIT/ML injection Commonly known as:  novoLOG Inject 0-6 Units into the skin 3 (three) times daily before meals. SSI: 70-200 --- 0 units, 201-250 -- 2 units, 251-300 -- 4 units, 301-350 --- 6 units, 351+ -- 8 units   insulin NPH Human 100 UNIT/ML injection Commonly known as:  HUMULIN N,NOVOLIN N Inject 25 Units into the skin 2 (two) times daily.   magnesium oxide 400 MG tablet Commonly known as:  MAG-OX Take 800 mg by mouth daily.   metoprolol succinate 25 MG 24 hr tablet Commonly known as:  TOPROL-XL Take 25 mg by mouth at bedtime.   ranitidine 75 MG tablet Commonly known as:  ZANTAC Take 75 mg by mouth at bedtime.   venlafaxine 37.5 MG tablet Commonly known as:  EFFEXOR Take 37.5 mg by mouth 2 (two) times daily.   vitamin B-12 1000 MCG tablet Commonly known as:  CYANOCOBALAMIN Take 1,000 mcg by mouth  daily.   XARELTO 20 MG Tabs tablet Generic drug:  rivaroxaban Take 20 mg by mouth daily.         Today   CHIEF COMPLAINT:  No issues overnight   VITAL SIGNS:  Blood pressure (!) 140/59, pulse 61, temperature 98.6 F (37 C), temperature source Oral, resp. rate 18, height 5\' 1"  (1.549 m), weight 74.4 kg (164 lb), SpO2 96 %.   REVIEW OF SYSTEMS:  Review of Systems  Unable to perform ROS: Dementia  Psychiatric/Behavioral: Positive for memory loss.     PHYSICAL EXAMINATION:  GENERAL:  72 y.o.-year-old patient lying in the bed with no acute distress.  NECK:  Supple, no jugular venous  distention. No thyroid enlargement, no tenderness.  LUNGS: Normal breath sounds bilaterally, no wheezing, rales,rhonchi  No use of accessory muscles of respiration.  CARDIOVASCULAR: S1, S2 normal. No murmurs, rubs, or gallops.  ABDOMEN: Soft, non-tender, non-distended. Bowel sounds present. No organomegaly or mass.  EXTREMITIES: No pedal edema, cyanosis, or clubbing.  PSYCHIATRIC: The patient is alert and oriented x name only  SKIN: No obvious rash, lesion, or ulcer.   DATA REVIEW:   CBC Recent Labs  Lab 12/31/17 1541  WBC 6.2  HGB 14.9  HCT 47.6*  PLT 129*    Chemistries  Recent Labs  Lab 12/28/17 0727  12/31/17 1541 01/01/18 0506  NA 140   < > 140 140  K 5.4*   < > 3.5 4.7  CL 105   < > 105 109  CO2 28   < > 26 23  GLUCOSE 93   < > 154* 143*  BUN 14   < > 11 12  CREATININE 1.03*   < > 0.75 0.79  CALCIUM 9.2   < > 9.2 8.8*  MG 1.8  --   --   --   AST 50*  --  63*  --   ALT 59*  --  68*  --   ALKPHOS 65  --  73  --   BILITOT 0.5  --  0.7  --    < > = values in this interval not displayed.    Cardiac Enzymes Recent Labs  Lab 12/28/17 0727 12/31/17 1541  TROPONINI <0.03 <0.03    Microbiology Results  @[id number] @  RADIOLOGY:  Dg Chest 2 View  Result Date: 12/31/2017 CLINICAL DATA:  Weakness. EXAM: CHEST  2 VIEW COMPARISON:  Chest x-ray dated December 28, 2017. FINDINGS: The heart size and mediastinal contours are within normal limits. Normal pulmonary vascularity. Low lung volumes. Mild bibasilar atelectasis. No focal consolidation, pleural effusion, or pneumothorax. No acute osseous abnormality. IMPRESSION: Bibasilar atelectasis.  No active cardiopulmonary disease. Electronically Signed   By: Obie Dredge M.D.   On: 12/31/2017 16:44   Ct Head Wo Contrast  Result Date: 12/31/2017 CLINICAL DATA:  Altered mental status.  Weakness. EXAM: CT HEAD WITHOUT CONTRAST TECHNIQUE: Contiguous axial images were obtained from the base of the skull through the vertex  without intravenous contrast. COMPARISON:  MRI brain and CT head dated December 28, 2017. FINDINGS: Brain: No evidence of acute infarction, hemorrhage, hydrocephalus, extra-axial collection or mass lesion/mass effect. Unchanged mild cerebral atrophy and moderate chronic microvascular ischemic changes. Stable atrophy of the brainstem with unchanged tiny calcification in the pons. Old right basal ganglia infarct again noted. Vascular: Calcified atherosclerosis at the skullbase. No hyperdense vessel. Skull: Normal. Negative for fracture or focal lesion. Sinuses/Orbits: No acute finding. Other: None. IMPRESSION: 1.  No acute intracranial abnormality. 2.  Unchanged chronic infarcts, mild cerebral atrophy, and moderate chronic microvascular ischemic changes. Electronically Signed   By: Obie DredgeWilliam T Derry M.D.   On: 12/31/2017 16:34      Allergies as of 01/01/2018      Reactions   Aspirin       Medication List    STOP taking these medications   cephALEXin 500 MG capsule Commonly known as:  KEFLEX     TAKE these medications   amLODipine 5 MG tablet Commonly known as:  NORVASC Take 5 mg by mouth daily.   atorvastatin 20 MG tablet Commonly known as:  LIPITOR Take 20 mg by mouth at bedtime.   cefUROXime 250 MG tablet Commonly known as:  CEFTIN Take 1 tablet (250 mg total) by mouth 2 (two) times daily with a meal for 6 days.   cholecalciferol 400 units Tabs tablet Commonly known as:  VITAMIN D Take 400 Units by mouth daily.   donepezil 10 MG tablet Commonly known as:  ARICEPT Take 10 mg by mouth at bedtime.   insulin aspart 100 UNIT/ML injection Commonly known as:  novoLOG Inject 0-6 Units into the skin 3 (three) times daily before meals. SSI: 70-200 --- 0 units, 201-250 -- 2 units, 251-300 -- 4 units, 301-350 --- 6 units, 351+ -- 8 units   insulin NPH Human 100 UNIT/ML injection Commonly known as:  HUMULIN N,NOVOLIN N Inject 25 Units into the skin 2 (two) times daily.   magnesium oxide  400 MG tablet Commonly known as:  MAG-OX Take 800 mg by mouth daily.   metoprolol succinate 25 MG 24 hr tablet Commonly known as:  TOPROL-XL Take 25 mg by mouth at bedtime.   ranitidine 75 MG tablet Commonly known as:  ZANTAC Take 75 mg by mouth at bedtime.   venlafaxine 37.5 MG tablet Commonly known as:  EFFEXOR Take 37.5 mg by mouth 2 (two) times daily.   vitamin B-12 1000 MCG tablet Commonly known as:  CYANOCOBALAMIN Take 1,000 mcg by mouth daily.   XARELTO 20 MG Tabs tablet Generic drug:  rivaroxaban Take 20 mg by mouth daily.          Management plans discussed with the patient's husband and he is in agreement. Stable for discharge  Patient should follow up with pcp  CODE STATUS:     Code Status Orders  (From admission, onward)        Start     Ordered   12/31/17 1857  Full code  Continuous     12/31/17 1856    Code Status History    Date Active Date Inactive Code Status Order ID Comments User Context   12/28/2017 09:23 12/30/2017 16:47 Full Code [id number]  Shaune Pollackhen, Qing, MD Inpatient    Advance Directive Documentation     Most Recent Value  Type of Advance Directive  Living will  Pre-existing out of facility DNR order (yellow form or pink MOST form)  No data  "MOST" Form in Place?  No data      TOTAL TIME TAKING CARE OF THIS PATIENT: 38 minutes.    Note: This dictation was prepared with Dragon dictation along with smaller phrase technology. Any transcriptional errors that result from this process are unintentional.  Jordana Dugue M.D on 01/01/2018 at 11:05 AM  Between 7am to 6pm - Pager - [phone] After 6pm go to [email or web address] - Social research officer, governmentpassword [email or web address]  [facility] [facility]  Office  [phone]  CC: Primary care physician; Leim FabryAldridge, Barbara, MD

## 2018-01-02 DIAGNOSIS — R4182 Altered mental status, unspecified: Secondary | ICD-10-CM | POA: Diagnosis not present

## 2018-01-02 DIAGNOSIS — G9341 Metabolic encephalopathy: Secondary | ICD-10-CM | POA: Diagnosis not present

## 2018-01-02 LAB — URINE CULTURE: Culture: NO GROWTH

## 2018-01-02 NOTE — Clinical Social Work Note (Signed)
CSW spoke to [facility] and they can accept patient back today.  Wendy KnackEric R. Laquentin Loudermilk, MSW, Theresia MajorsLCSWA [phone]  01/02/2018 8:51 AM

## 2018-01-02 NOTE — Plan of Care (Signed)
  Progressing Pain Managment: General experience of comfort will improve 01/02/2018 0042 - Progressing by Myles GipKimrey, Ronel Rodeheaver M, RN Safety: Ability to remain free from injury will improve 01/02/2018 0042 - Progressing by Myles GipKimrey, Amya Hlad M, RN Skin Integrity: Risk for impaired skin integrity will decrease 01/02/2018 0042 - Progressing by Myles GipKimrey, Pradeep Beaubrun M, RN

## 2018-01-02 NOTE — Progress Notes (Signed)
Pt discharged to [facility] [place] via EMS, report given to Tammy RN, pt prescriptions, and AVS in envelope.

## 2018-01-02 NOTE — Care Management Important Message (Signed)
Important Message  Patient Details  Name: Venetia ConstableMary I Waggoner MRN: [id number] Date of Birth: 1946-04-02   Medicare Important Message Given:  N/A - LOS <3 / Initial given by admissions    Eber HongGreene, Analis Distler R, RN 01/02/2018, 12:10 PM

## 2018-01-02 NOTE — Progress Notes (Addendum)
[facility] [place] at [facility]   PATIENT NAME: Wendy [name]    MR#:  [id number]  DATE OF BIRTH:  09-08-1946  SUBJECTIVE:   No issues   REVIEW OF SYSTEMS:    Unable to obtain due to dementia   Tolerating Diet: yes      DRUG ALLERGIES:   Allergies  Allergen Reactions  . Aspirin     VITALS:  Blood pressure 138/87, pulse 66, temperature 98.5 F (36.9 C), temperature source Oral, resp. rate 18, height 5\' 1"  (1.549 m), weight 74.6 kg (164 lb 8 oz), SpO2 98 %.  PHYSICAL EXAMINATION:  Constitutional: Appears well-developed and well-nourished. No distress. HENT: Normocephalic. Marland Kitchen Oropharynx is clear and moist.  Eyes: Conjunctivae and EOM are normal. PERRLA, no scleral icterus.  Neck: Normal ROM. Neck supple. No JVD. No tracheal deviation. CVS: RRR, S1/S2 +, no murmurs, no gallops, no carotid bruit.  Pulmonary: Effort and breath sounds normal, no stridor, rhonchi, wheezes, rales.  Abdominal: Soft. BS +,  no distension, tenderness, rebound or guarding.  Musculoskeletal: Normal range of motion. No edema and no tenderness.  Neuro: Alert. CN 2-12 grossly intact. No focal deficits. Skin: Skin is warm and dry. No rash noted. Psychiatric: Dementia    LABORATORY PANEL:   CBC Recent Labs  Lab 01/01/18 1034  WBC 4.4  HGB 13.7  HCT 43.3  PLT 103*   ------------------------------------------------------------------------------------------------------------------  Chemistries  Recent Labs  Lab 12/28/17 0727  12/31/17 1541 01/01/18 0506  NA 140   < > 140 140  K 5.4*   < > 3.5 4.7  CL 105   < > 105 109  CO2 28   < > 26 23  GLUCOSE 93   < > 154* 143*  BUN 14   < > 11 12  CREATININE 1.03*   < > 0.75 0.79  CALCIUM 9.2   < > 9.2 8.8*  MG 1.8  --   --   --   AST 50*  --  63*  --   ALT 59*  --  68*  --   ALKPHOS 65  --  73  --   BILITOT 0.5  --  0.7  --    < > = values in this interval not displayed.    ------------------------------------------------------------------------------------------------------------------  Cardiac Enzymes Recent Labs  Lab 12/28/17 0727 12/31/17 1541  TROPONINI <0.03 <0.03   ------------------------------------------------------------------------------------------------------------------  RADIOLOGY:  Dg Chest 2 View  Result Date: 12/31/2017 CLINICAL DATA:  Weakness. EXAM: CHEST  2 VIEW COMPARISON:  Chest x-ray dated December 28, 2017. FINDINGS: The heart size and mediastinal contours are within normal limits. Normal pulmonary vascularity. Low lung volumes. Mild bibasilar atelectasis. No focal consolidation, pleural effusion, or pneumothorax. No acute osseous abnormality. IMPRESSION: Bibasilar atelectasis.  No active cardiopulmonary disease. Electronically Signed   By: Obie Dredge M.D.   On: 12/31/2017 16:44   Ct Head Wo Contrast  Result Date: 12/31/2017 CLINICAL DATA:  Altered mental status.  Weakness. EXAM: CT HEAD WITHOUT CONTRAST TECHNIQUE: Contiguous axial images were obtained from the base of the skull through the vertex without intravenous contrast. COMPARISON:  MRI brain and CT head dated December 28, 2017. FINDINGS: Brain: No evidence of acute infarction, hemorrhage, hydrocephalus, extra-axial collection or mass lesion/mass effect. Unchanged mild cerebral atrophy and moderate chronic microvascular ischemic changes. Stable atrophy of the brainstem with unchanged tiny calcification in the pons. Old right basal ganglia infarct again noted. Vascular: Calcified atherosclerosis at the skullbase. No hyperdense vessel. Skull: Normal.  Negative for fracture or focal lesion. Sinuses/Orbits: No acute finding. Other: None. IMPRESSION: 1.  No acute intracranial abnormality. 2. Unchanged chronic infarcts, mild cerebral atrophy, and moderate chronic microvascular ischemic changes. Electronically Signed   By: Obie DredgeWilliam T Derry M.D.   On: 12/31/2017 16:34     ASSESSMENT  AND PLAN:   72 year old female with a history of Alzheimer's dementia and essential hypertension who presents with acute encephalopathy.  1. Acute encephalopathy on dementia: Acute encephalopathy is metabolic in nature due to Citrobacter UTI. Cultures are taken from 3/23. She will be discharged on oral Ceftin. She seems to be at baseline.  2. Diabetes: She will continue outpatient regimen with ADA diet  3. Essential hypertension: Continue metoprolol and Norvasc  4. Hyponatremia: Continue statin  5. Depression: Continue Effexor or  6. History of CVA: Continue Xarelto and Lipitor  7. History of PAF: Continue Xarelto and metoprolol for heart rate control        Management plans discussed with the patient's husband is in agreement.  CODE STATUS: FULL  TOTAL TIME TAKING CARE OF THIS PATIENT: 30 minutes.     POSSIBLE D/C today, DEPENDING ON CLINICAL CONDITION.   Jarely Juncaj M.D on 01/02/2018 at 11:38 AM  Between 7am to 6pm - Pager - [phone] After 6pm go to [email or web address] - password [email or web address]  [facility] [place] [facility]  Office  [phone]  CC: Primary care physician; Leim FabryAldridge, Barbara, MD  Note: This dictation was prepared with Dragon dictation along with smaller phrase technology. Any transcriptional errors that result from this process are unintentional.

## 2018-03-11 ENCOUNTER — Other Ambulatory Visit: Payer: Self-pay

## 2018-03-11 ENCOUNTER — Emergency Department
Admission: EM | Admit: 2018-03-11 | Discharge: 2018-03-11 | Disposition: A | Discharge status: Home / Self Care | Payer: Medicare Other | Attending: Emergency Medicine | Admitting: Emergency Medicine

## 2018-03-11 ENCOUNTER — Encounter: Payer: Self-pay | Admitting: Emergency Medicine

## 2018-03-11 ENCOUNTER — Emergency Department: Payer: Medicare Other

## 2018-03-11 DIAGNOSIS — Z79899 Other long term (current) drug therapy: Secondary | ICD-10-CM | POA: Insufficient documentation

## 2018-03-11 DIAGNOSIS — E119 Type 2 diabetes mellitus without complications: Secondary | ICD-10-CM | POA: Diagnosis not present

## 2018-03-11 DIAGNOSIS — Z794 Long term (current) use of insulin: Secondary | ICD-10-CM | POA: Insufficient documentation

## 2018-03-11 DIAGNOSIS — R531 Weakness: Secondary | ICD-10-CM | POA: Diagnosis present

## 2018-03-11 DIAGNOSIS — I10 Essential (primary) hypertension: Secondary | ICD-10-CM | POA: Insufficient documentation

## 2018-03-11 DIAGNOSIS — G309 Alzheimer's disease, unspecified: Secondary | ICD-10-CM | POA: Diagnosis not present

## 2018-03-11 LAB — CBC
HCT: 48.5 % — ABNORMAL HIGH (ref 35.0–47.0)
Hemoglobin: 15.6 g/dL (ref 12.0–16.0)
MCH: 22.3 pg — ABNORMAL LOW (ref 26.0–34.0)
MCHC: 32.3 g/dL (ref 32.0–36.0)
MCV: 69 fL — AB (ref 80.0–100.0)
PLATELETS: 134 10*3/uL — AB (ref 150–440)
RBC: 7.02 MIL/uL — ABNORMAL HIGH (ref 3.80–5.20)
RDW: 15.1 % — AB (ref 11.5–14.5)
WBC: 4.5 10*3/uL (ref 3.6–11.0)

## 2018-03-11 LAB — URINALYSIS, COMPLETE (UACMP) WITH MICROSCOPIC
Bacteria, UA: NONE SEEN
Bilirubin Urine: NEGATIVE
Glucose, UA: NEGATIVE mg/dL
Ketones, ur: NEGATIVE mg/dL
LEUKOCYTES UA: NEGATIVE
Nitrite: NEGATIVE
PROTEIN: 30 mg/dL — AB
SPECIFIC GRAVITY, URINE: 1.014 (ref 1.005–1.030)
Squamous Epithelial / LPF: NONE SEEN (ref 0–5)
pH: 6 (ref 5.0–8.0)

## 2018-03-11 LAB — BASIC METABOLIC PANEL
Anion gap: 6 (ref 5–15)
BUN: 18 mg/dL (ref 6–20)
CHLORIDE: 105 mmol/L (ref 101–111)
CO2: 29 mmol/L (ref 22–32)
CREATININE: 0.99 mg/dL (ref 0.44–1.00)
Calcium: 9 mg/dL (ref 8.9–10.3)
GFR calc Af Amer: >60 mL/min (ref >60)
GFR calc non Af Amer: 57 mL/min — ABNORMAL LOW (ref >60)
GLUCOSE: 128 mg/dL — AB (ref 65–99)
POTASSIUM: 4.6 mmol/L (ref 3.5–5.1)
SODIUM: 140 mmol/L (ref 135–145)

## 2018-03-11 LAB — LACTIC ACID, PLASMA: LACTIC ACID, VENOUS: 1 mmol/L (ref 0.5–1.9)

## 2018-03-11 LAB — TROPONIN I

## 2018-03-11 MED ORDER — SODIUM CHLORIDE 0.9 % IV BOLUS
500.0000 mL | Freq: Once | INTRAVENOUS | Status: AC
  Administered 2018-03-11: 500 mL via INTRAVENOUS

## 2018-03-11 MED ORDER — METOPROLOL SUCCINATE ER 50 MG PO TB24
25.0000 mg | ORAL_TABLET | Freq: Every day | ORAL | Status: DC
  Administered 2018-03-11: 25 mg via ORAL
  Filled 2018-03-11: qty 1

## 2018-03-11 MED ORDER — AMLODIPINE BESYLATE 5 MG PO TABS
5.0000 mg | ORAL_TABLET | Freq: Once | ORAL | Status: AC
  Administered 2018-03-11: 5 mg via ORAL
  Filled 2018-03-11: qty 1

## 2018-03-11 NOTE — ED Notes (Signed)
Nurse attempted to call PT on two different numbers with no answer at this time.

## 2018-03-11 NOTE — ED Notes (Signed)
Nurse called dietary for pt's food tray.

## 2018-03-11 NOTE — Care Management (Signed)
RNCM met with patient and her husband in regards transition of care.  She is open to [facility] home health. I have provided list of private duty agencies however he has someone available 5 hours and a $500 voucher for private duty per husband. She has been declining for last 7 years per husband.  She has a hospital bed, bedside commode, wheelchair and will need EMS to home.  No need for PT evaluation.  I shared explanation of long term care verses short term care as patient has had several round with SNF for rehab. MD updated that PT evaluation will not be necessary. Raquel Sarna with PT updated that PT eval not needed this visit. I provided to husband [employer] firm name to reach out to to assist with long term planning as it appears patient is going to need.

## 2018-03-11 NOTE — ED Triage Notes (Signed)
Pt in via [facility] from home, per husband, pt with generalized weakness and just "not herself" since awaking this morning.  Pt A/O to self upon arrival, NAD noted at this time.  Pt with hx of CVA with left side deficits at baseline.

## 2018-03-11 NOTE — ED Notes (Signed)
Nurse brought pt lunch tray that was delivered. Nurse helped pt reposition to eat and juice was placed in a cup for her.

## 2018-03-11 NOTE — ED Notes (Signed)
Nurse helped pt put her pants back on. Pt in NAD. Spouse called about pt's transport home.

## 2018-03-11 NOTE — Discharge Instructions (Addendum)
Return to the ER for new, worsening, or persistent weakness, fevers, vomiting, inability to eat or drink, abdominal or chest pain, difficulty breathing, or any other new or worsening symptoms that concern you.  Follow-up with the primary care doctor.

## 2018-03-11 NOTE — ED Provider Notes (Signed)
[facility] Emergency Department Provider Note ____________________________________________   First MD Initiated Contact with Patient 03/11/18 1104     (approximate)  I have reviewed the triage vital signs and the nursing notes.   HISTORY  Chief Complaint Weakness  Level 5 caveat: Unable to obtain complete HPI due to altered mental status  HPI Wendy [name] is a 72 y.o. female with PMH as noted below who presents with altered mental status acute onset today, associated with generalized weakness, difficulty with ambulation, and not eating.  Patient's husband states that she was in her normal state of health yesterday.  He states that symptoms are somewhat similar to when she had a UTI few months ago.  Patient denies pain or other symptoms currently, but states she is hungry.   Past Medical History:  Diagnosis Date  . Alzheimer's dementia   . Depression   . Diabetes mellitus without complication (HCC)   . GERD (gastroesophageal reflux disease)   . Hyperlipidemia   . Hypertension   . Seizure disorder (HCC)   . Stroke (HCC)   . Urinary retention   . UTI (urinary tract infection)     Patient Active Problem List   Diagnosis Date Noted  . Altered mental status 12/31/2017  . Acute metabolic encephalopathy 12/28/2017  . DIABETES MELLITUS, TYPE II 01/18/2011  . LABYRINTHITIS, ACUTE 01/18/2011  . HYPERTENSION 01/18/2011    Past Surgical History:  Procedure Laterality Date  . ABDOMINAL HYSTERECTOMY    . APPENDECTOMY    . HEMIARTHROPLASTY SHOULDER FRACTURE    . JOINT REPLACEMENT      Prior to Admission medications   Medication Sig Start Date End Date Taking? Authorizing Provider  amLODipine (NORVASC) 5 MG tablet Take 5 mg by mouth daily.    [provider]  atorvastatin (LIPITOR) 20 MG tablet Take 20 mg by mouth at bedtime.    [provider]  cholecalciferol (VITAMIN D) 400 units TABS tablet Take 400 Units by mouth daily.     [provider]  donepezil (ARICEPT) 10 MG tablet Take 10 mg by mouth at bedtime.    [provider]  insulin aspart (NOVOLOG) 100 UNIT/ML injection Inject 0-6 Units into the skin 3 (three) times daily before meals. SSI: 70-200 --- 0 units, 201-250 -- 2 units, 251-300 -- 4 units, 301-350 --- 6 units, 351+ -- 8 units    [provider]  insulin NPH Human (HUMULIN N,NOVOLIN N) 100 UNIT/ML injection Inject 25 Units into the skin 2 (two) times daily.    [provider]  magnesium oxide (MAG-OX) 400 MG tablet Take 800 mg by mouth daily.    [provider]  metoprolol succinate (TOPROL-XL) 25 MG 24 hr tablet Take 25 mg by mouth at bedtime.    [provider]  ranitidine (ZANTAC) 75 MG tablet Take 75 mg by mouth at bedtime.    [provider]  rivaroxaban (XARELTO) 20 MG TABS tablet Take 20 mg by mouth daily.    [provider]  venlafaxine (EFFEXOR) 37.5 MG tablet Take 37.5 mg by mouth 2 (two) times daily.    [provider]  vitamin B-12 (CYANOCOBALAMIN) 1000 MCG tablet Take 1,000 mcg by mouth daily.    [provider]    Allergies Aspirin  Family History  Problem Relation Age of Onset  . CAD Mother   . Diabetes Father   . CAD Sister   . Breast cancer Neg Hx     Social History  Social History   Tobacco Use  . Smoking status: Never Smoker  . Smokeless tobacco: Never Used  Substance Use Topics  . Alcohol use: No    Frequency: Never  . Drug use: No    Review of Systems Level 5 caveat: Unable to obtain review of systems due to altered mental status    ____________________________________________   PHYSICAL EXAM:  VITAL SIGNS: ED Triage Vitals  Enc Vitals Group     BP 03/11/18 1046 (!) 127/100     Pulse Rate 03/11/18 1046 60     Resp 03/11/18 1046 14     Temp 03/11/18 1046 98.1 F (36.7 C)     Temp Source 03/11/18 1046 Oral     SpO2 03/11/18 1046 98 %     Weight 03/11/18 1047 164 lb  (74.4 kg)     Height 03/11/18 1047  (1.626 m)     Head Circumference --      Peak Flow --      Pain Score 03/11/18 1047 0     Pain Loc --      Pain Edu? --      Excl. in GC? --     Constitutional: Alert, oriented x1.  Somewhat weak but relatively comfortable appearing. Eyes: Conjunctivae are normal.  EOMI.  PERRLA. Head: Atraumatic. Nose: No congestion/rhinnorhea. Mouth/Throat: Mucous membranes are slightly dry.   Neck: Normal range of motion.  Cardiovascular: Normal rate, regular rhythm. Grossly normal heart sounds.  Good peripheral circulation. Respiratory: Normal respiratory effort.  No retractions. Lungs CTAB. Gastrointestinal: Soft and nontender. No distention.  Genitourinary: No flank tenderness. Musculoskeletal: No lower extremity edema.  Extremities warm and well perfused.  Neurologic:  Normal speech and language.  Motor intact in all extremities.  Following commands normally. Skin:  Skin is warm and dry. No rash noted. Psychiatric:  Calm and cooperative.  ____________________________________________   LABS (all labs ordered are listed, but only abnormal results are displayed)  Labs Reviewed  BASIC METABOLIC PANEL - Abnormal; Notable for the following components:      Result Value   Glucose, Bld 128 (*)    GFR calc non Af Amer 57 (*)    All other components within normal limits  CBC - Abnormal; Notable for the following components:   RBC 7.02 (*)    HCT 48.5 (*)    MCV 69.0 (*)    MCH 22.3 (*)    RDW 15.1 (*)    Platelets 134 (*)    All other components within normal limits  URINALYSIS, COMPLETE (UACMP) WITH MICROSCOPIC - Abnormal; Notable for the following components:   Color, Urine YELLOW (*)    APPearance CLEAR (*)    Hgb urine dipstick MODERATE (*)    Protein, ur 30 (*)    All other components within normal limits  LACTIC ACID, PLASMA  TROPONIN I  CBG MONITORING, ED   ____________________________________________  EKG  ED ECG REPORT I,  Dionne Bucy, the attending physician, personally viewed and interpreted this ECG.  Date: 03/11/2018 EKG Time: 1052 Rate: 58 Rhythm: normal sinus rhythm QRS Axis: normal Intervals: normal ST/T Wave abnormalities: Nonspecific anterolateral abnormalities Narrative Interpretation: no evidence of acute ischemia  ____________________________________________  RADIOLOGY  CXR: No focal infiltrate  ____________________________________________   PROCEDURES  Procedure(s) performed: No  Procedures  Critical Care performed: No ____________________________________________   INITIAL IMPRESSION / ASSESSMENT AND PLAN / ED COURSE  Pertinent labs & imaging results that were available during my care of the patient were  reviewed by me and considered in my medical decision making (see chart for details).  72 year old female with PMH as noted above presents with altered mental status and generalized weakness, acute onset today.  No focal neurologic symptoms, fever, or acute pain.  I reviewed the past medical records in Epic; the patient was most recently admitted in February for UTI and altered mental status.  The husband states her presentation was somewhat similar.  On exam, the vital signs are normal, the patient is somewhat confused compared to baseline and weak appearing, but the remainder the exam is unremarkable.  Differential includes recurrent UTI, other infection, dehydration or other metabolic cause, progression of dementia, or medication side effect.  No focal neurologic findings to suggest stroke or warrant imaging at this time.  We will obtain basic labs, infection work-up, and reassess.  ----------------------------------------- 1:36 PM on 03/11/2018 -----------------------------------------  Patient's lab work-up is unremarkable, and her UA is not consistent with acute UTI.  No findings on chest x-ray.  The patient appears somewhat more comfortable and energetic,  although she is still weak.  I discussed the results of the work-up with the husband over the phone.  He states that due to the patient's increased weakness, he has had difficulty taking care of her at home.  There is no indication for admission to the internal medicine service at this time.  We will obtain PT and social work evaluation in the ED for possible placement or home services.  ----------------------------------------- 3:30 PM on 03/11/2018 -----------------------------------------  Case management has evaluated the patient and advises that the patient has already had recent PT eval so there is no indication for today.  The patient already has home care and has used her available days at an SNF.  Based on the caseworkers discussion with the husband, he is comfortable taking her home as long as she is transferred by ambulance.  The patient's vital signs have remained stable, and she is safe for discharge home at this time.  Return precautions given, and the husband expressed understanding.  ____________________________________________   FINAL CLINICAL IMPRESSION(S) / ED DIAGNOSES  Final diagnoses:  Generalized weakness      NEW MEDICATIONS STARTED DURING THIS VISIT:  New Prescriptions   No medications on file     Note:  This document was prepared using Dragon voice recognition software and may include unintentional dictation errors.    Dionne Bucy, MD 03/11/18 [id number]

## 2018-03-11 NOTE — ED Notes (Signed)
Called for transport by [facility] to home  [id number]

## 2018-03-11 NOTE — ED Notes (Signed)
Meal tray placed at bedside.

## 2018-03-11 NOTE — ED Notes (Signed)
Writer gave pt water per request.

## 2018-03-11 NOTE — ED Notes (Signed)
Nurse spoke with Wendy [name] from SW and he is going to speak with CM about pt's case.

## 2018-03-11 NOTE — ED Notes (Signed)
Nurse repositioned pt and asked if she needed anything. Pt stating that she is fine. Pt helped with taking a couple sips of water. Pt also given graham crackers at this time.

## 2018-03-11 NOTE — Clinical Social Work Note (Addendum)
CSW updated case manager, that patient's husband would like some extra help in the home due to patient's weakness.  Case manager to follow up with patient and her husband.  3:45pm CSW spoke to [employer], family is requesting placement for her, however patient will have to pay privately at $270 a day, per patient's husband they can not afford it.  Case manager has been notified, and is working on home health services to be restarted in the home.  CSW to sign off, bedside nurse has been updated.  Ervin Knack. Latysha Thackston, MSW, Theresia Majors [phone]  03/11/2018 2:28 PM

## 2018-05-01 ENCOUNTER — Other Ambulatory Visit: Payer: Self-pay

## 2018-05-01 ENCOUNTER — Emergency Department: Payer: Medicare Other

## 2018-05-01 ENCOUNTER — Emergency Department
Admission: EM | Admit: 2018-05-01 | Discharge: 2018-05-01 | Disposition: A | Discharge status: Home / Self Care | Payer: Medicare Other | Attending: Emergency Medicine | Admitting: Emergency Medicine

## 2018-05-01 DIAGNOSIS — Z794 Long term (current) use of insulin: Secondary | ICD-10-CM | POA: Diagnosis not present

## 2018-05-01 DIAGNOSIS — R55 Syncope and collapse: Secondary | ICD-10-CM | POA: Diagnosis not present

## 2018-05-01 DIAGNOSIS — Z79899 Other long term (current) drug therapy: Secondary | ICD-10-CM | POA: Insufficient documentation

## 2018-05-01 DIAGNOSIS — I10 Essential (primary) hypertension: Secondary | ICD-10-CM | POA: Insufficient documentation

## 2018-05-01 DIAGNOSIS — G308 Other Alzheimer's disease: Secondary | ICD-10-CM | POA: Diagnosis not present

## 2018-05-01 DIAGNOSIS — F0280 Dementia in other diseases classified elsewhere without behavioral disturbance: Secondary | ICD-10-CM | POA: Diagnosis not present

## 2018-05-01 DIAGNOSIS — E119 Type 2 diabetes mellitus without complications: Secondary | ICD-10-CM | POA: Insufficient documentation

## 2018-05-01 LAB — CBC
HEMATOCRIT: 44.3 % (ref 35.0–47.0)
HEMOGLOBIN: 14 g/dL (ref 12.0–16.0)
MCH: 21.8 pg — ABNORMAL LOW (ref 26.0–34.0)
MCHC: 31.6 g/dL — ABNORMAL LOW (ref 32.0–36.0)
MCV: 69.2 fL — ABNORMAL LOW (ref 80.0–100.0)
Platelets: 109 10*3/uL — ABNORMAL LOW (ref 150–440)
RBC: 6.41 MIL/uL — ABNORMAL HIGH (ref 3.80–5.20)
RDW: 15.9 % — ABNORMAL HIGH (ref 11.5–14.5)
WBC: 6 10*3/uL (ref 3.6–11.0)

## 2018-05-01 LAB — COMPREHENSIVE METABOLIC PANEL
ALBUMIN: 3.2 g/dL — AB (ref 3.5–5.0)
ALK PHOS: 59 U/L (ref 38–126)
ALT: 40 U/L (ref 0–44)
AST: 44 U/L — AB (ref 15–41)
Anion gap: 8 (ref 5–15)
BILIRUBIN TOTAL: 0.3 mg/dL (ref 0.3–1.2)
BUN: 14 mg/dL (ref 8–23)
CALCIUM: 9 mg/dL (ref 8.9–10.3)
CO2: 25 mmol/L (ref 22–32)
Chloride: 106 mmol/L (ref 98–111)
Creatinine, Ser: 0.97 mg/dL (ref 0.44–1.00)
GFR calc Af Amer: >60 mL/min (ref >60)
GFR calc non Af Amer: 58 mL/min — ABNORMAL LOW (ref >60)
GLUCOSE: 194 mg/dL — AB (ref 70–99)
Potassium: 5.1 mmol/L (ref 3.5–5.1)
Sodium: 139 mmol/L (ref 135–145)
TOTAL PROTEIN: 6.4 g/dL — AB (ref 6.5–8.1)

## 2018-05-01 LAB — TROPONIN I: Troponin I: <0.03 ng/mL (ref <0.03)

## 2018-05-01 LAB — URINALYSIS, COMPLETE (UACMP) WITH MICROSCOPIC
BILIRUBIN URINE: NEGATIVE
Bacteria, UA: NONE SEEN
Glucose, UA: 50 mg/dL — AB
Ketones, ur: NEGATIVE mg/dL
Leukocytes, UA: NEGATIVE
Nitrite: NEGATIVE
PROTEIN: 100 mg/dL — AB
SPECIFIC GRAVITY, URINE: 1.014 (ref 1.005–1.030)
pH: 6 (ref 5.0–8.0)

## 2018-05-01 NOTE — ED Notes (Signed)
Pt cleaned, pressure dressing and brief applied. Noted small open area on sacrum.

## 2018-05-01 NOTE — ED Notes (Signed)
[facility]

## 2018-05-01 NOTE — ED Provider Notes (Signed)
[facility] Emergency Department Provider Note   ____________________________________________    I have reviewed the triage vital signs and the nursing notes.   HISTORY  Chief Complaint Loss of Consciousness     HPI Wendy [name] is a 72 y.o. female who presents after possible near syncopal episode.  Apparently the patient was leaving her PCPs office and became lightheaded and had to be helped into the car.  Family reports that she has a history of dementia, is essentially bedbound.    Past Medical History:  Diagnosis Date  . Alzheimer's dementia   . Depression   . Diabetes mellitus without complication (HCC)   . GERD (gastroesophageal reflux disease)   . Hyperlipidemia   . Hypertension   . Seizure disorder (HCC)   . Stroke (HCC)   . Urinary retention   . UTI (urinary tract infection)     Patient Active Problem List   Diagnosis Date Noted  . Altered mental status 12/31/2017  . Acute metabolic encephalopathy 12/28/2017  . DIABETES MELLITUS, TYPE II 01/18/2011  . LABYRINTHITIS, ACUTE 01/18/2011  . HYPERTENSION 01/18/2011    Past Surgical History:  Procedure Laterality Date  . ABDOMINAL HYSTERECTOMY    . APPENDECTOMY    . HEMIARTHROPLASTY SHOULDER FRACTURE    . JOINT REPLACEMENT      Prior to Admission medications   Medication Sig Start Date End Date Taking? Authorizing Provider  amLODipine (NORVASC) 5 MG tablet Take 5 mg by mouth daily.    [provider]  atorvastatin (LIPITOR) 20 MG tablet Take 20 mg by mouth at bedtime.    [provider]  cholecalciferol (VITAMIN D) 400 units TABS tablet Take 400 Units by mouth daily.    [provider]  donepezil (ARICEPT) 10 MG tablet Take 10 mg by mouth at bedtime.    [provider]  insulin aspart (NOVOLOG) 100 UNIT/ML injection Inject 0-6 Units into the skin 3 (three) times daily before meals. SSI: 70-200 --- 0 units, 201-250 -- 2 units, 251-300 -- 4  units, 301-350 --- 6 units, 351+ -- 8 units    [provider]  insulin NPH Human (HUMULIN N,NOVOLIN N) 100 UNIT/ML injection Inject 25 Units into the skin 2 (two) times daily.    [provider]  magnesium oxide (MAG-OX) 400 MG tablet Take 800 mg by mouth daily.    [provider]  metoprolol succinate (TOPROL-XL) 25 MG 24 hr tablet Take 25 mg by mouth at bedtime.    [provider]  ranitidine (ZANTAC) 75 MG tablet Take 75 mg by mouth at bedtime.    [provider]  rivaroxaban (XARELTO) 20 MG TABS tablet Take 20 mg by mouth daily.    [provider]  venlafaxine (EFFEXOR) 37.5 MG tablet Take 37.5 mg by mouth 2 (two) times daily.    [provider]  vitamin B-12 (CYANOCOBALAMIN) 1000 MCG tablet Take 1,000 mcg by mouth daily.    [provider]     Allergies Aspirin  Family History  Problem Relation Age of Onset  . CAD Mother   . Diabetes Father   . CAD Sister   . Breast cancer Neg Hx     Social History Social History   Tobacco Use  . Smoking status: Never Smoker  . Smokeless tobacco: Never Used  Substance Use Topics  . Alcohol use: No    Frequency: Never  . Drug use: No    Review of Systems limited by  dementia  Constitutional: No reports of fever Eyes: No discharge ENT: No neck pain Cardiovascular: No reports of chest pain Respiratory: No cough Gastrointestinal: No diarrhea Genitourinary: History of UTIs Musculoskeletal: No joint swelling Skin: Negative for rash. Neurological: Negative for new neuro deficits   ____________________________________________   PHYSICAL EXAM:  VITAL SIGNS: ED Triage Vitals  Enc Vitals Group     BP 05/01/18 1330 123/83     Pulse Rate 05/01/18 1307 77     Resp 05/01/18 1307 13     Temp 05/01/18 1307 98.1 F (36.7 C)     Temp Source 05/01/18 1307 Oral     SpO2 05/01/18 1330 95 %     Weight 05/01/18 1310 76.7 kg (169 lb)     Height 05/01/18 1310 1.575 m (5'  2")     Head Circumference --      Peak Flow --      Pain Score 05/01/18 1308 0     Pain Loc --      Pain Edu? --      Excl. in GC? --     Constitutional: Alert. No acute distress. Head: Atraumatic. Nose: No congestion/rhinnorhea. Mouth/Throat: Mucous membranes are moist.    Cardiovascular: Normal rate, regular rhythm. Grossly normal heart sounds.  Good peripheral circulation. Respiratory: Normal respiratory effort.  No retractions. Lungs CTAB. Gastrointestinal: Soft and nontender. No distention.    Musculoskeletal: No joint swelling warm and well perfused Neurologic:  Normal speech and language. No gross focal neurologic deficits are appreciated.  Skin:  Skin is warm, dry and intact. No rash noted. Psychiatric: Patient is calm  ____________________________________________   LABS (all labs ordered are listed, but only abnormal results are displayed)  Labs Reviewed  CBC - Abnormal; Notable for the following components:      Result Value   RBC 6.41 (*)    MCV 69.2 (*)    MCH 21.8 (*)    MCHC 31.6 (*)    RDW 15.9 (*)    Platelets 109 (*)    All other components within normal limits  COMPREHENSIVE METABOLIC PANEL - Abnormal; Notable for the following components:   Glucose, Bld 194 (*)    Total Protein 6.4 (*)    Albumin 3.2 (*)    AST 44 (*)    GFR calc non Af Amer 58 (*)    All other components within normal limits  URINALYSIS, COMPLETE (UACMP) WITH MICROSCOPIC - Abnormal; Notable for the following components:   Color, Urine YELLOW (*)    APPearance CLEAR (*)    Glucose, UA 50 (*)    Hgb urine dipstick SMALL (*)    Protein, ur 100 (*)    All other components within normal limits  TROPONIN I   ____________________________________________  EKG  ED ECG REPORT I, Jene Everyobert Marzell Isakson, the attending physician, personally viewed and interpreted this ECG.  Date: 05/01/2018  Rhythm: normal sinus rhythm QRS Axis: normal Intervals: normal ST/T Wave abnormalities:  normal Narrative Interpretation: no evidence of acute ischemia  ____________________________________________  RADIOLOGY  Chest x-ray negative for pneumonia ____________________________________________   PROCEDURES  Procedure(s) performed: No  Procedures   Critical Care performed: No ____________________________________________   INITIAL IMPRESSION / ASSESSMENT AND PLAN / ED COURSE  Pertinent labs & imaging results that were available during my care of the patient were reviewed by me and considered in my medical decision making (see chart for details).  Patient with a history of dementia who requires significant home care and is nonambulatory presents  after an episode of near syncope.  In the emergency department she appears to be at her baseline.  We are giving IV fluids and will check labs  Lab work is overall unremarkable, chest x-ray negative for pneumonia, urinalysis shows small amount of blood but otherwise is unremarkable, apparently she is starting an antibiotic for UTI today.  Discussed with family and recommended discharge as do not see significant benefit to hospitalization and they agree.    ____________________________________________   FINAL CLINICAL IMPRESSION(S) / ED DIAGNOSES  Final diagnoses:  Near syncope        Note:  This document was prepared using Dragon voice recognition software and may include unintentional dictation errors.    Jene Every, MD 05/01/18 [phone]

## 2018-05-01 NOTE — ED Triage Notes (Signed)
[facility] [place] with c/o syncopal episode in parking lot. EMS states that the syncopal episode was not witnessed but EMS was called by pt. EMS states that pt was feeling hot and was given 350 mL of NS. EMS states pt was recently diagnosed with UTI.

## 2018-05-01 NOTE — ED Notes (Signed)
Pt urinated on herself, she was cleaned and we In and Out Cathed her. Pure Wic placed at this time.

## 2018-07-11 ENCOUNTER — Emergency Department: Payer: Medicare Other

## 2018-07-11 ENCOUNTER — Observation Stay
Admission: EM | Admit: 2018-07-11 | Discharge: 2018-07-13 | Disposition: A | Discharge status: Home / Self Care | Payer: Medicare Other | Attending: Internal Medicine | Admitting: Internal Medicine

## 2018-07-11 ENCOUNTER — Other Ambulatory Visit: Payer: Self-pay

## 2018-07-11 DIAGNOSIS — I700 Atherosclerosis of aorta: Secondary | ICD-10-CM | POA: Insufficient documentation

## 2018-07-11 DIAGNOSIS — Z7401 Bed confinement status: Secondary | ICD-10-CM | POA: Insufficient documentation

## 2018-07-11 DIAGNOSIS — F0280 Dementia in other diseases classified elsewhere without behavioral disturbance: Secondary | ICD-10-CM | POA: Diagnosis not present

## 2018-07-11 DIAGNOSIS — N390 Urinary tract infection, site not specified: Secondary | ICD-10-CM | POA: Diagnosis present

## 2018-07-11 DIAGNOSIS — I10 Essential (primary) hypertension: Secondary | ICD-10-CM | POA: Diagnosis not present

## 2018-07-11 DIAGNOSIS — Y731 Therapeutic (nonsurgical) and rehabilitative gastroenterology and urology devices associated with adverse incidents: Secondary | ICD-10-CM | POA: Diagnosis not present

## 2018-07-11 DIAGNOSIS — B9689 Other specified bacterial agents as the cause of diseases classified elsewhere: Secondary | ICD-10-CM | POA: Diagnosis not present

## 2018-07-11 DIAGNOSIS — Z886 Allergy status to analgesic agent status: Secondary | ICD-10-CM | POA: Insufficient documentation

## 2018-07-11 DIAGNOSIS — Z7901 Long term (current) use of anticoagulants: Secondary | ICD-10-CM | POA: Insufficient documentation

## 2018-07-11 DIAGNOSIS — Y92009 Unspecified place in unspecified non-institutional (private) residence as the place of occurrence of the external cause: Secondary | ICD-10-CM | POA: Diagnosis not present

## 2018-07-11 DIAGNOSIS — G309 Alzheimer's disease, unspecified: Secondary | ICD-10-CM | POA: Diagnosis not present

## 2018-07-11 DIAGNOSIS — E785 Hyperlipidemia, unspecified: Secondary | ICD-10-CM | POA: Insufficient documentation

## 2018-07-11 DIAGNOSIS — Z79899 Other long term (current) drug therapy: Secondary | ICD-10-CM | POA: Insufficient documentation

## 2018-07-11 DIAGNOSIS — J322 Chronic ethmoidal sinusitis: Secondary | ICD-10-CM | POA: Insufficient documentation

## 2018-07-11 DIAGNOSIS — Z8673 Personal history of transient ischemic attack (TIA), and cerebral infarction without residual deficits: Secondary | ICD-10-CM | POA: Insufficient documentation

## 2018-07-11 DIAGNOSIS — E162 Hypoglycemia, unspecified: Secondary | ICD-10-CM | POA: Diagnosis present

## 2018-07-11 DIAGNOSIS — Z794 Long term (current) use of insulin: Secondary | ICD-10-CM | POA: Insufficient documentation

## 2018-07-11 DIAGNOSIS — E11649 Type 2 diabetes mellitus with hypoglycemia without coma: Principal | ICD-10-CM | POA: Insufficient documentation

## 2018-07-11 DIAGNOSIS — T83511A Infection and inflammatory reaction due to indwelling urethral catheter, initial encounter: Secondary | ICD-10-CM | POA: Insufficient documentation

## 2018-07-11 DIAGNOSIS — F329 Major depressive disorder, single episode, unspecified: Secondary | ICD-10-CM | POA: Insufficient documentation

## 2018-07-11 DIAGNOSIS — G40909 Epilepsy, unspecified, not intractable, without status epilepticus: Secondary | ICD-10-CM | POA: Insufficient documentation

## 2018-07-11 DIAGNOSIS — K219 Gastro-esophageal reflux disease without esophagitis: Secondary | ICD-10-CM | POA: Diagnosis not present

## 2018-07-11 DIAGNOSIS — R4182 Altered mental status, unspecified: Secondary | ICD-10-CM

## 2018-07-11 LAB — HEMOGLOBIN A1C
HEMOGLOBIN A1C: 6.7 % — AB (ref 4.8–5.6)
Mean Plasma Glucose: 145.59 mg/dL

## 2018-07-11 LAB — CBC
HCT: 43.9 % (ref 35.0–47.0)
Hemoglobin: 14.2 g/dL (ref 12.0–16.0)
MCH: 22.3 pg — ABNORMAL LOW (ref 26.0–34.0)
MCHC: 32.4 g/dL (ref 32.0–36.0)
MCV: 68.8 fL — ABNORMAL LOW (ref 80.0–100.0)
Platelets: 160 K/uL (ref 150–440)
RBC: 6.38 MIL/uL — ABNORMAL HIGH (ref 3.80–5.20)
RDW: 14.8 % — ABNORMAL HIGH (ref 11.5–14.5)
WBC: 6.5 K/uL (ref 3.6–11.0)

## 2018-07-11 LAB — URINALYSIS, COMPLETE (UACMP) WITH MICROSCOPIC
Bilirubin Urine: NEGATIVE
Glucose, UA: NEGATIVE mg/dL
Ketones, ur: NEGATIVE mg/dL
Nitrite: NEGATIVE
Protein, ur: 30 mg/dL — AB
RBC / HPF: >50 RBC/hpf — ABNORMAL HIGH (ref 0–5)
Specific Gravity, Urine: 1.01 (ref 1.005–1.030)
pH: 8 (ref 5.0–8.0)

## 2018-07-11 LAB — GLUCOSE, CAPILLARY
GLUCOSE-CAPILLARY: 112 mg/dL — AB (ref 70–99)
GLUCOSE-CAPILLARY: 116 mg/dL — AB (ref 70–99)
Glucose-Capillary: 75 mg/dL (ref 70–99)
Glucose-Capillary: 316 mg/dL — ABNORMAL HIGH (ref 70–99)
Glucose-Capillary: 316 mg/dL — ABNORMAL HIGH (ref 70–99)

## 2018-07-11 LAB — COMPREHENSIVE METABOLIC PANEL
ALBUMIN: 3.3 g/dL — AB (ref 3.5–5.0)
ALK PHOS: 60 U/L (ref 38–126)
ALT: 27 U/L (ref 0–44)
ANION GAP: 5 (ref 5–15)
AST: 33 U/L (ref 15–41)
BILIRUBIN TOTAL: 0.7 mg/dL (ref 0.3–1.2)
BUN: 12 mg/dL (ref 8–23)
CALCIUM: 9.3 mg/dL (ref 8.9–10.3)
CO2: 29 mmol/L (ref 22–32)
Chloride: 104 mmol/L (ref 98–111)
Creatinine, Ser: 0.93 mg/dL (ref 0.44–1.00)
GFR calc Af Amer: >60 mL/min (ref >60)
GFR calc non Af Amer: >60 mL/min (ref >60)
GLUCOSE: 118 mg/dL — AB (ref 70–99)
Potassium: 3.9 mmol/L (ref 3.5–5.1)
Sodium: 138 mmol/L (ref 135–145)
TOTAL PROTEIN: 6.6 g/dL (ref 6.5–8.1)

## 2018-07-11 LAB — TROPONIN I: Troponin I: <0.03 ng/mL

## 2018-07-11 MED ORDER — SODIUM CHLORIDE 0.9 % IV BOLUS: 500.0000 mL | Freq: Once | INTRAVENOUS | Status: DC

## 2018-07-11 MED ORDER — ACETAMINOPHEN 650 MG RE SUPP: 650.0000 mg | Freq: Four times a day (QID) | RECTAL | Status: DC | PRN

## 2018-07-11 MED ORDER — FAMOTIDINE 20 MG PO TABS
20.0000 mg | ORAL_TABLET | Freq: Every day | ORAL | Status: DC
  Administered 2018-07-11 – 2018-07-12 (×2): 20 mg via ORAL
  Filled 2018-07-11 (×2): qty 1

## 2018-07-11 MED ORDER — LISINOPRIL 20 MG PO TABS
20.0000 mg | ORAL_TABLET | Freq: Every day | ORAL | Status: DC
  Administered 2018-07-12 – 2018-07-13 (×2): 20 mg via ORAL
  Filled 2018-07-11 (×2): qty 1

## 2018-07-11 MED ORDER — CHOLECALCIFEROL 10 MCG (400 UNIT) PO TABS
400.0000 [IU] | ORAL_TABLET | Freq: Every day | ORAL | Status: DC
  Administered 2018-07-12 – 2018-07-13 (×2): 400 [IU] via ORAL
  Filled 2018-07-11 (×2): qty 1

## 2018-07-11 MED ORDER — ONDANSETRON HCL 4 MG PO TABS: 4.0000 mg | ORAL_TABLET | Freq: Four times a day (QID) | ORAL | Status: DC | PRN

## 2018-07-11 MED ORDER — AMOXICILLIN 250 MG PO CAPS
250.0000 mg | ORAL_CAPSULE | Freq: Every day | ORAL | Status: DC
  Administered 2018-07-12 – 2018-07-13 (×2): 250 mg via ORAL
  Filled 2018-07-11 (×2): qty 1

## 2018-07-11 MED ORDER — ONDANSETRON HCL 4 MG/2ML IJ SOLN: 4.0000 mg | Freq: Four times a day (QID) | INTRAMUSCULAR | Status: DC | PRN

## 2018-07-11 MED ORDER — ATORVASTATIN CALCIUM 20 MG PO TABS
40.0000 mg | ORAL_TABLET | Freq: Every day | ORAL | Status: DC
  Administered 2018-07-12 – 2018-07-13 (×2): 40 mg via ORAL
  Filled 2018-07-11 (×2): qty 2

## 2018-07-11 MED ORDER — POTASSIUM CHLORIDE CRYS ER 20 MEQ PO TBCR
20.0000 meq | EXTENDED_RELEASE_TABLET | Freq: Every day | ORAL | Status: DC
  Administered 2018-07-12 – 2018-07-13 (×2): 20 meq via ORAL
  Filled 2018-07-11 (×2): qty 1

## 2018-07-11 MED ORDER — VITAMIN B-12 1000 MCG PO TABS
1000.0000 ug | ORAL_TABLET | Freq: Every day | ORAL | Status: DC
  Administered 2018-07-12 – 2018-07-13 (×2): 1000 ug via ORAL
  Filled 2018-07-11 (×2): qty 1

## 2018-07-11 MED ORDER — RIVAROXABAN 15 MG PO TABS
15.0000 mg | ORAL_TABLET | Freq: Every day | ORAL | Status: DC
  Administered 2018-07-12 – 2018-07-13 (×2): 15 mg via ORAL
  Filled 2018-07-11 (×2): qty 1

## 2018-07-11 MED ORDER — INSULIN ASPART 100 UNIT/ML ~~LOC~~ SOLN
0.0000 [IU] | Freq: Three times a day (TID) | SUBCUTANEOUS | Status: DC
  Administered 2018-07-12: 1 [IU] via SUBCUTANEOUS
  Administered 2018-07-12: 3 [IU] via SUBCUTANEOUS
  Administered 2018-07-12: 2 [IU] via SUBCUTANEOUS
  Filled 2018-07-11 (×3): qty 1

## 2018-07-11 MED ORDER — INSULIN ASPART 100 UNIT/ML ~~LOC~~ SOLN
0.0000 [IU] | Freq: Every day | SUBCUTANEOUS | Status: DC
  Administered 2018-07-11: 4 [IU] via SUBCUTANEOUS
  Filled 2018-07-11: qty 1

## 2018-07-11 MED ORDER — ACETAMINOPHEN 325 MG PO TABS
650.0000 mg | ORAL_TABLET | Freq: Four times a day (QID) | ORAL | Status: DC | PRN
  Administered 2018-07-12 (×2): 650 mg via ORAL
  Filled 2018-07-11 (×2): qty 2

## 2018-07-11 MED ORDER — DOCUSATE SODIUM 100 MG PO CAPS
100.0000 mg | ORAL_CAPSULE | Freq: Two times a day (BID) | ORAL | Status: DC
  Administered 2018-07-12 – 2018-07-13 (×3): 100 mg via ORAL
  Filled 2018-07-11 (×3): qty 1

## 2018-07-11 MED ORDER — NYSTATIN 100000 UNIT/GM EX CREA
TOPICAL_CREAM | Freq: Two times a day (BID) | CUTANEOUS | Status: DC
  Administered 2018-07-11: 22:00:00 via TOPICAL
  Administered 2018-07-12: 1 via TOPICAL
  Administered 2018-07-12 – 2018-07-13 (×2): via TOPICAL
  Filled 2018-07-11: qty 15

## 2018-07-11 MED ORDER — VENLAFAXINE HCL 37.5 MG PO TABS
37.5000 mg | ORAL_TABLET | Freq: Two times a day (BID) | ORAL | Status: DC
  Administered 2018-07-11 – 2018-07-13 (×4): 37.5 mg via ORAL
  Filled 2018-07-11 (×5): qty 1

## 2018-07-11 MED ORDER — DONEPEZIL HCL 23 MG PO TABS
23.0000 mg | ORAL_TABLET | Freq: Every day | ORAL | Status: DC
  Administered 2018-07-11 – 2018-07-12 (×2): 23 mg via ORAL
  Filled 2018-07-11 (×3): qty 1

## 2018-07-11 MED ORDER — MAGNESIUM OXIDE 400 (241.3 MG) MG PO TABS
400.0000 mg | ORAL_TABLET | Freq: Two times a day (BID) | ORAL | Status: DC
  Administered 2018-07-11 – 2018-07-13 (×4): 400 mg via ORAL
  Filled 2018-07-11 (×4): qty 1

## 2018-07-11 NOTE — Progress Notes (Signed)
Bolus IVF ordered from the ED.  Dr patel gave order to cancel due to elevated BP at this time

## 2018-07-11 NOTE — ED Triage Notes (Signed)
Pt arrives from home via [facility]. Pt spouse stated pt AMS this morning, CBG 28, EMS gave D50. Upon Pt arrival to ED, CBG 120. Pt is alert to baseline, post stroke, poor historian. Pt denies pain, protocol initiated.

## 2018-07-11 NOTE — ED Notes (Signed)
Pt to CT scan at this time.

## 2018-07-11 NOTE — Progress Notes (Signed)
Patient ID: Wendy [name], female   DOB: [birth year], 72 y.o.   MRN: [id number]  ACP note  Patient unable to participate in this conversation.  Husband making decisions.  Diagnosis: Hypoglycemia, acute encephalopathy, complication of urinary catheter with colonization, history of stroke, hypertension, GERD, dementia, depression, hyperlipidemia  CODE STATUS discussed.  Patient followed by hospice and is a full code.  Plan.  Observe overnight for hypoglycemia.  Put on sliding scale insulin.  Patient now eating.  Gentle fluid bolus.  Time spent on ACP discussion 17 minutes Dr. Alford Highland

## 2018-07-11 NOTE — H&P (Signed)
[facility] [employer] at [facility]   PATIENT NAME: Wendy [name]    MR#:  [id number]  DATE OF BIRTH:  [birth year]  DATE OF ADMISSION:  07/11/2018  PRIMARY CARE PHYSICIAN: Leim Fabry, MD   REQUESTING/REFERRING PHYSICIAN: Dr Sharyn Creamer  CHIEF COMPLAINT:   Chief Complaint  Patient presents with  . Hypoglycemia    HISTORY OF PRESENT ILLNESS:  Wendy [name]  is a 72 y.o. female sent in by hospice for altered mental status and hypoglycemic episode this morning.  Patient able to answer only a few yes or no questions.  She was busy eating when I came to evaluate her.  History obtained from husband at the bedside.  She has had a couple episodes of sweating at home.  She has a catheter was placed for comfort and keeping her dry.  Husband stated it was changed last Saturday.  Patient's been having some intermittent low sugars.  Patient unable to give any history.  Dr. Fanny Bien asked to admit for urinary tract infection but I told him that I just change the catheter if she is not septic.  Then he asked to observe for hypoglycemia.  PAST MEDICAL HISTORY:   Past Medical History:  Diagnosis Date  . Alzheimer's dementia   . Depression   . Diabetes mellitus without complication (HCC)   . GERD (gastroesophageal reflux disease)   . Hyperlipidemia   . Hypertension   . Seizure disorder (HCC)   . Stroke (HCC)   . Urinary retention   . UTI (urinary tract infection)     PAST SURGICAL HISTORY:   Past Surgical History:  Procedure Laterality Date  . ABDOMINAL HYSTERECTOMY    . APPENDECTOMY    . HEMIARTHROPLASTY SHOULDER FRACTURE    . JOINT REPLACEMENT      SOCIAL HISTORY:   Social History   Tobacco Use  . Smoking status: Never Smoker  . Smokeless tobacco: Never Used  Substance Use Topics  . Alcohol use: No    Frequency: Never    FAMILY HISTORY:   Family History  Problem Relation Age of Onset  . CAD Mother   . Diabetes Father   . CAD Father   . CAD Sister    . Breast cancer Neg Hx     DRUG ALLERGIES:   Allergies  Allergen Reactions  . Aspirin Other (See Comments)    Heartburn     REVIEW OF SYSTEMS:  Unable to provide secondary to dementia  MEDICATIONS AT HOME:   Prior to Admission medications   Medication Sig Start Date End Date Taking? Authorizing Provider  acetaminophen (TYLENOL) 325 MG tablet Take 650 mg by mouth 2 (two) times daily as needed for mild pain.   Yes [provider]  amoxicillin (AMOXIL) 250 MG capsule Take 250 mg by mouth daily. 07/01/18  Yes [provider]  atorvastatin (LIPITOR) 40 MG tablet Take 40 mg by mouth daily. 06/16/18  Yes [provider]  cholecalciferol (VITAMIN D) 400 units TABS tablet Take 400 Units by mouth daily.   Yes [provider]  Cod Liver Oil OIL Take 1 tablet by mouth daily.   Yes [provider]  docusate sodium (COLACE) 100 MG capsule Take 100 mg by mouth 2 (two) times daily. 07/01/18  Yes [provider]  donepezil (ARICEPT) 23 MG TABS tablet Take 23 mg by mouth at bedtime.   Yes [provider]  insulin aspart (NOVOLOG) 100 UNIT/ML injection Inject 0-6 Units into the skin 3 (three)  times daily before meals. SSI: 70-200 --- 0 units, 201-250 -- 2 units, 251-300 -- 4 units, 301-350 --- 6 units, 351+ -- 8 units   Yes [provider]  insulin NPH Human (HUMULIN N,NOVOLIN N) 100 UNIT/ML injection Inject 25 Units into the skin 2 (two) times daily.   Yes [provider]  lisinopril (PRINIVIL,ZESTRIL) 20 MG tablet Take 20 mg by mouth daily. 07/03/18  Yes [provider]  magnesium oxide (MAG-OX) 400 (241.3 Mg) MG tablet Take 1 tablet by mouth 2 (two) times daily. 07/01/18  Yes [provider]  nystatin cream (MYCOSTATIN) Apply topically 2 (two) times daily. 05/01/18 05/01/19 Yes [provider]  potassium chloride SA (K-DUR,KLOR-CON) 20 MEQ tablet Take 20 mEq by mouth daily. 07/01/18  Yes [provider]  ranitidine (ZANTAC) 75 MG tablet Take 75 mg by mouth at bedtime.   Yes [provider]  venlafaxine (EFFEXOR) 37.5 MG tablet Take 37.5 mg by mouth 2 (two) times daily.   Yes [provider]  vitamin B-12 (CYANOCOBALAMIN) 1000 MCG tablet Take 1,000 mcg by mouth daily.   Yes [provider]  XARELTO 15 MG TABS tablet Take 15 mg by mouth daily. 06/27/18  Yes [provider]   Medication reconciliation still undergoing  VITAL SIGNS:  Blood pressure (!) 157/85, pulse 67, temperature 98.5 F (36.9 C), temperature source Oral, height 5\' 2"  (1.575 m), weight 76 kg, SpO2 98 %.  PHYSICAL EXAMINATION:  GENERAL:  72 y.o.-year-old patient lying in the bed with no acute distress.  EYES: Pupils equal, round, reactive to light and accommodation. No scleral icterus.   HEENT: Head atraumatic, normocephalic. Oropharynx and nasopharynx clear.  NECK:  Supple, no jugular venous distention. No thyroid enlargement, no tenderness.  LUNGS: Normal breath sounds bilaterally, no wheezing, rales,rhonchi or crepitation. No use of accessory muscles of respiration.  CARDIOVASCULAR: S1, S2 normal. No murmurs, rubs, or gallops.  ABDOMEN: Soft, nontender, nondistended. Bowel sounds present. No organomegaly or mass.  EXTREMITIES: Trace pedal edema, no cyanosis, or clubbing.  NEUROLOGIC: Cranial nerves II through XII are intact.  Patient able to lift right leg up off the bed.  Barely able to lift left leg off the bed.. Gait not checked.  PSYCHIATRIC: The patient is alert and is feeding herself with her right hand.  SKIN: No rash, lesion, or ulcer.   LABORATORY PANEL:   CBC Recent Labs  Lab 07/11/18 1239  WBC 6.5  HGB 14.2  HCT 43.9  PLT 160   ------------------------------------------------------------------------------------------------------------------  Chemistries  Recent Labs  Lab 07/11/18 1239  NA 138  K 3.9  CL 104  CO2 29  GLUCOSE 118*  BUN 12   CREATININE 0.93  CALCIUM 9.3  AST 33  ALT 27  ALKPHOS 60  BILITOT 0.7   ------------------------------------------------------------------------------------------------------------------  Cardiac Enzymes Recent Labs  Lab 07/11/18 1239  TROPONINI <0.03   ------------------------------------------------------------------------------------------------------------------  RADIOLOGY:  Ct Head Wo Contrast  Result Date: 07/11/2018 CLINICAL DATA:  Altered mental status starting this morning EXAM: CT HEAD WITHOUT CONTRAST TECHNIQUE: Contiguous axial images were obtained from the base of the skull through the vertex without intravenous contrast. COMPARISON:  12/31/2017 FINDINGS: Brain: Remote infarcts involving the basal ganglia right greater than left, and anterior right thalamus noted. There is some mild hypodensity in the right pons on image 10/4 which appears to have been present on prior MRI brain and probably a lacunar infarct. Periventricular white matter and corona radiata hypodensities favor chronic ischemic microvascular white matter disease.  No intracranial hemorrhage, mass lesion, or acute CVA. Accentuated density along the right posterior cavernous sinus region on image 9/4 appears to be due to tortuous internal carotid artery, for example comparing back to the MRI from 07/22/2012. Vascular: There is atherosclerotic calcification of the cavernous carotid arteries bilaterally. Skull: Unremarkable Sinuses/Orbits: Minimal chronic right ethmoid sinusitis. Other: No supplemental non-categorized findings. IMPRESSION: 1. Chronic infarcts involving the basal ganglia and pons, without acute intracranial findings. 2. Periventricular white matter and corona radiata hypodensities favor chronic ischemic microvascular white matter disease. 3. Mild chronic right ethmoid sinusitis. 4. Atherosclerosis. Electronically Signed   By: Gaylyn Rong M.D.   On: 07/11/2018 14:23   Dg Chest Portable 1  View  Result Date: 07/11/2018 CLINICAL DATA:  Pt arrives from home via [facility]. Pt spouse stated pt AMS this morning, Pt is alert to baseline, post stroke, poor historian. EXAM: PORTABLE CHEST 1 VIEW COMPARISON:  05/01/2017 FINDINGS: Heart size is normal. There is mild tortuosity and focal calcification within the thoracic aorta. The lungs are clear. No pulmonary edema. Surgical clips are present in the RIGHT UPPER QUADRANT the abdomen. IMPRESSION: No evidence for acute abnormality. Aortic atherosclerosis. (ICD10-I70.0) Electronically Signed   By: Norva Pavlov M.D.   On: 07/11/2018 14:30    EKG:   Normal sinus rhythm 64 bpm, LVH, early repolarization  IMPRESSION AND PLAN:   1.  Hypoglycemic episode.  Likely will have to cut back on the 70/30 insulin.  Watch the next couple sugars and put on sliding scale at this point. 2.  Complication of urinary catheter likely with colonization.  I do not treat urinary tract infections from catheters unless they are septic.  Patient is not septic at this point.  Asked the nurse in the ER to discontinue her catheter in place a new one. 3.  Stroke history on Xarelto 4.  Hypertension on lisinopril 5.  GERD on Zantac 6.  Dementia and depression on Effexor and Aricept 7.  Hyperlipidemia unspecified on atorvastatin  All the records are reviewed and case discussed with ED provider. Management plans discussed with the patient, family and they are in agreement.  CODE STATUS: Full code TOTAL TIME TAKING CARE OF THIS PATIENT: 50 minutes, including ACP time.    Alford Highland M.D on 07/11/2018 at 5:22 PM  Between 7am to 6pm - Pager - [phone]  After 6pm call admission pager [phone]  [facility]  [phone]  CC: Primary care physician; Leim Fabry, MD

## 2018-07-11 NOTE — Progress Notes (Signed)
ED visit made. Patient is currently followed by [facility] at home with a hospice diagnosis of Late effect CVA. She is a full code. She was sent to the [facility] ED today for evaluation of altered mental status. Patient was minimally arousable  this morning with low blood sugar.. Per report of her hospice nurse her blood sugars have been labile all week. She is currently on a  maintenance antibiotic with foley catheter placed on 8/19 for skin protection. Patient seen sitting up on the ED stretcher, alert, eating ice cream, abe to greet RN quietly. Husband Wendy [name]  at beside. Per chart note report blood sugar was 20 when checked by EMS. Wendy [name] reports patient is back to her baseline and related the morning events. Patient did eat this morning. She is currently on 25 units of NPH daily. Wendy [name] keeps very close track of her blood sugars and shared his record with Clinical research associate.  Per discussion with EDP Dr. Fanny Bien, there may be adjustments made to her long acting insulin. Awaiting UA results, head CT negative for any acute process. Patient will most likely discharge back home. Patient resting, continuing to eat her 4th cup of ice cream. Emotional support provided. Hospice team updated.  Dayna Barker RN, BSN, [facility] [place], [facility] liaison [phone]

## 2018-07-11 NOTE — ED Notes (Signed)
Report to Erica, RN

## 2018-07-11 NOTE — ED Notes (Signed)
Pt adult brief changed after BM.

## 2018-07-12 LAB — GLUCOSE, CAPILLARY
GLUCOSE-CAPILLARY: 151 mg/dL — AB (ref 70–99)
GLUCOSE-CAPILLARY: 191 mg/dL — AB (ref 70–99)
Glucose-Capillary: 161 mg/dL — ABNORMAL HIGH (ref 70–99)
Glucose-Capillary: 225 mg/dL — ABNORMAL HIGH (ref 70–99)

## 2018-07-12 LAB — CBC
HEMATOCRIT: 41.9 % (ref 35.0–47.0)
Hemoglobin: 13.5 g/dL (ref 12.0–16.0)
MCH: 22.5 pg — AB (ref 26.0–34.0)
MCHC: 32.2 g/dL (ref 32.0–36.0)
MCV: 70 fL — AB (ref 80.0–100.0)
Platelets: 152 10*3/uL (ref 150–440)
RBC: 5.99 MIL/uL — ABNORMAL HIGH (ref 3.80–5.20)
RDW: 14.9 % — AB (ref 11.5–14.5)
WBC: 7.1 10*3/uL (ref 3.6–11.0)

## 2018-07-12 LAB — BASIC METABOLIC PANEL
Anion gap: 4 — ABNORMAL LOW (ref 5–15)
BUN: 13 mg/dL (ref 8–23)
CHLORIDE: 106 mmol/L (ref 98–111)
CO2: 28 mmol/L (ref 22–32)
CREATININE: 0.95 mg/dL (ref 0.44–1.00)
Calcium: 9 mg/dL (ref 8.9–10.3)
GFR calc Af Amer: >60 mL/min (ref >60)
GFR calc non Af Amer: 60 mL/min — ABNORMAL LOW (ref >60)
Glucose, Bld: 185 mg/dL — ABNORMAL HIGH (ref 70–99)
POTASSIUM: 4.4 mmol/L (ref 3.5–5.1)
SODIUM: 138 mmol/L (ref 135–145)

## 2018-07-12 MED ORDER — INSULIN NPH (HUMAN) (ISOPHANE) 100 UNIT/ML ~~LOC~~ SUSP
10.0000 [IU] | Freq: Two times a day (BID) | SUBCUTANEOUS | Status: DC
  Filled 2018-07-12: qty 10

## 2018-07-12 MED ORDER — INSULIN NPH (HUMAN) (ISOPHANE) 100 UNIT/ML ~~LOC~~ SUSP
10.0000 [IU] | Freq: Two times a day (BID) | SUBCUTANEOUS | Status: DC
  Administered 2018-07-12: 10 [IU] via SUBCUTANEOUS
  Filled 2018-07-12 (×2): qty 10

## 2018-07-12 MED ORDER — INSULIN NPH (HUMAN) (ISOPHANE) 100 UNIT/ML ~~LOC~~ SUSP
15.0000 [IU] | Freq: Two times a day (BID) | SUBCUTANEOUS | Status: DC
  Administered 2018-07-12 – 2018-07-13 (×2): 15 [IU] via SUBCUTANEOUS
  Filled 2018-07-12 (×3): qty 10

## 2018-07-12 NOTE — Progress Notes (Signed)
   07/12/18 1000  Clinical Encounter Type  Visited With Patient  Visit Type Initial  Referral From Physician  Consult/Referral To Chaplain  Advance Directives (For Healthcare)  Does Patient Have a Medical Advance Directive? Yes  Does patient want to make changes to medical advance directive? Yes (Inpatient - patient requests chaplain consult to change a medical advance directive)  Type of Advance Directive Living will  Copy of Healthcare Power of Attorney in Chart? No - copy requested  Copy of Living Will in Chart? No - copy requested  Wendy [name]  is a 73 y.o. female sent in by hospice for altered mental status and hypoglycemic episode, she has a pmhx of Dementia, seizure and prior CVA. CH received an (OR) to complete AD education and or create/update (AD). [name] presented to the room, knocked on the door, patient was resting quietly. Pastoral presence ensued, Patient is minimally verbal. CH gave reason for visit, patient seemingly acknowledged, but not sure she understood the purpose of the visit or was the visit patient generated or generated by an individual whom may already be a HCPOA. The CH is unable to ascertain if the patient is mentating properly to understand (AD) education, complete or update an (AD). The patient was nodding off, thus the [facility] will return later to reassess the patient's mentation and overall neurological status, to see if (AD) education, update or completion can be done later.

## 2018-07-12 NOTE — Progress Notes (Signed)
[facility] [place] at [facility]   PATIENT NAME: Wendy [name]    MR#:  [id number]  DATE OF BIRTH:  02-14-1946  SUBJECTIVE:  CHIEF COMPLAINT: Patient is very lethargic today.  Husband at bedside. Not hypoglycemic anymore  REVIEW OF SYSTEMS:  Review of system unobtainable patient has chronic Alzheimer's dementia and lethargic   DRUG ALLERGIES:   Allergies  Allergen Reactions  . Aspirin Other (See Comments)    Heartburn     VITALS:  Blood pressure 130/89, pulse 74, temperature 98 F (36.7 C), temperature source Oral, resp. rate 18, height 5\' 2"  (1.575 m), weight 76 kg, SpO2 98 %.  PHYSICAL EXAMINATION:  GENERAL:  72 y.o.-year-old patient lying in the bed with no acute distress.  EYES: Pupils equal, round, reactive to light and accommodation. No scleral icterus. Extraocular muscles intact.  HEENT: Head atraumatic, normocephalic. Oropharynx and nasopharynx clear.  NECK:  Supple, no jugular venous distention. No thyroid enlargement, no tenderness.  LUNGS: Normal breath sounds bilaterally, no wheezing, rales,rhonchi or crepitation. No use of accessory muscles of respiration.  CARDIOVASCULAR: S1, S2 normal. No murmurs, rubs, or gallops.  ABDOMEN: Soft, nontender, nondistended. Bowel sounds present.   EXTREMITIES: No pedal edema, cyanosis, or clubbing.  NEUROLOGIC: Arousable but disoriented  pSYCHIATRIC: The patient is disoriented SKIN: No obvious rash, lesion, or ulcer.    LABORATORY PANEL:   CBC Recent Labs  Lab 07/12/18 0515  WBC 7.1  HGB 13.5  HCT 41.9  PLT 152   ------------------------------------------------------------------------------------------------------------------  Chemistries  Recent Labs  Lab 07/11/18 1239 07/12/18 0515  NA 138 138  K 3.9 4.4  CL 104 106  CO2 29 28  GLUCOSE 118* 185*  BUN 12 13  CREATININE 0.93 0.95  CALCIUM 9.3 9.0  AST 33  --   ALT 27  --   ALKPHOS 60  --   BILITOT 0.7  --     ------------------------------------------------------------------------------------------------------------------  Cardiac Enzymes Recent Labs  Lab 07/11/18 1239  TROPONINI <0.03   ------------------------------------------------------------------------------------------------------------------  RADIOLOGY:  Ct Head Wo Contrast  Result Date: 07/11/2018 CLINICAL DATA:  Altered mental status starting this morning EXAM: CT HEAD WITHOUT CONTRAST TECHNIQUE: Contiguous axial images were obtained from the base of the skull through the vertex without intravenous contrast. COMPARISON:  12/31/2017 FINDINGS: Brain: Remote infarcts involving the basal ganglia right greater than left, and anterior right thalamus noted. There is some mild hypodensity in the right pons on image 10/4 which appears to have been present on prior MRI brain and probably a lacunar infarct. Periventricular white matter and corona radiata hypodensities favor chronic ischemic microvascular white matter disease. No intracranial hemorrhage, mass lesion, or acute CVA. Accentuated density along the right posterior cavernous sinus region on image 9/4 appears to be due to tortuous internal carotid artery, for example comparing back to the MRI from 07/22/2012. Vascular: There is atherosclerotic calcification of the cavernous carotid arteries bilaterally. Skull: Unremarkable Sinuses/Orbits: Minimal chronic right ethmoid sinusitis. Other: No supplemental non-categorized findings. IMPRESSION: 1. Chronic infarcts involving the basal ganglia and pons, without acute intracranial findings. 2. Periventricular white matter and corona radiata hypodensities favor chronic ischemic microvascular white matter disease. 3. Mild chronic right ethmoid sinusitis. 4. Atherosclerosis. Electronically Signed   By: Gaylyn Rong M.D.   On: 07/11/2018 14:23   Dg Chest Portable 1 View  Result Date: 07/11/2018 CLINICAL DATA:  Pt arrives from home via [facility]. Pt  spouse stated pt AMS this morning, Pt is alert to baseline, post stroke, poor historian.  EXAM: PORTABLE CHEST 1 VIEW COMPARISON:  05/01/2017 FINDINGS: Heart size is normal. There is mild tortuosity and focal calcification within the thoracic aorta. The lungs are clear. No pulmonary edema. Surgical clips are present in the RIGHT UPPER QUADRANT the abdomen. IMPRESSION: No evidence for acute abnormality. Aortic atherosclerosis. (ICD10-I70.0) Electronically Signed   By: Norva Pavlov M.D.   On: 07/11/2018 14:30    EKG:   Orders placed or performed during the hospital encounter of 07/11/18  . EKG 12-Lead  . EKG 12-Lead    ASSESSMENT AND PLAN:    .1  Hypoglycemic episodes.  Decreased NPH insulin to 15 units titrate as needed Continue sliding scale insulin with close monitoring of her sugars  2.  Complication of urinary catheter likely with colonization.    No need to treat urinary tract infections from catheters unless they are septic.  Patient is not septic at this point.    Admitting physician has asked  the nurse in the ER to discontinue her catheter in place a new one.  3.  Stroke history on Xarelto  4.  Hypertension on lisinopril  5.  GERD on Zantac  6.  Dementia and depression on Effexor and Aricept  7.  Hyperlipidemia unspecified on atorvastatin    All the records are reviewed and case discussed with Care Management/Social Workerr. Management plans discussed with the patient, husband at bedside and he is in agreement   CODE STATUS: fc  TOTAL TIME TAKING CARE OF THIS PATIENT: 34 minutes.   POSSIBLE D/C IN 1 DAYS, DEPENDING ON CLINICAL CONDITION.  Note: This dictation was prepared with Dragon dictation along with smaller phrase technology. Any transcriptional errors that result from this process are unintentional.   Ramonita Lab M.D on 07/12/2018 at 3:09 PM  Between 7am to 6pm - Pager - [phone] After 6pm go to [email or web address] - password [email or web address] [facility]  [place]  [facility]ospital[facility]  Office  [phone]  CC: Primary care physician; Leim Fabry, MD

## 2018-07-13 LAB — GLUCOSE, CAPILLARY
GLUCOSE-CAPILLARY: 96 mg/dL (ref 70–99)
GLUCOSE-CAPILLARY: 284 mg/dL — AB (ref 70–99)
Glucose-Capillary: 175 mg/dL — ABNORMAL HIGH (ref 70–99)

## 2018-07-13 MED ORDER — INSULIN NPH (HUMAN) (ISOPHANE) 100 UNIT/ML ~~LOC~~ SUSP: 15.0000 [IU] | Freq: Two times a day (BID) | SUBCUTANEOUS | 11 refills | Status: AC

## 2018-07-13 NOTE — Progress Notes (Signed)
Wendy [name]  A and O x 2. VSS. Pt tolerating diet well. No complaints of pain or nausea. IV removed intact, no new prescriptions given. Pt's family voiced understanding of discharge instructions with no further questions. Pt discharged via EMS to her house.    Allergies as of 07/13/2018      Reactions   Aspirin Other (See Comments)   Heartburn      Medication List    TAKE these medications   acetaminophen 325 MG tablet Commonly known as:  TYLENOL Take 650 mg by mouth 2 (two) times daily as needed for mild pain.   amoxicillin 250 MG capsule Commonly known as:  AMOXIL Take 250 mg by mouth daily.   atorvastatin 40 MG tablet Commonly known as:  LIPITOR Take 40 mg by mouth daily.   cholecalciferol 400 units Tabs tablet Commonly known as:  VITAMIN D Take 400 Units by mouth daily.   Cod Liver Oil Oil Take 1 tablet by mouth daily.   docusate sodium 100 MG capsule Commonly known as:  COLACE Take 100 mg by mouth 2 (two) times daily.   donepezil 23 MG Tabs tablet Commonly known as:  ARICEPT Take 23 mg by mouth at bedtime.   insulin aspart 100 UNIT/ML injection Commonly known as:  novoLOG Inject 0-6 Units into the skin 3 (three) times daily before meals. SSI: 70-200 --- 0 units, 201-250 -- 2 units, 251-300 -- 4 units, 301-350 --- 6 units, 351+ -- 8 units   insulin NPH Human 100 UNIT/ML injection Commonly known as:  HUMULIN N,NOVOLIN N Inject 0.15 mLs (15 Units total) into the skin 2 (two) times daily. What changed:  how much to take   lisinopril 20 MG tablet Commonly known as:  PRINIVIL,ZESTRIL Take 20 mg by mouth daily.   magnesium oxide 400 (241.3 Mg) MG tablet Commonly known as:  MAG-OX Take 1 tablet by mouth 2 (two) times daily.   nystatin cream Commonly known as:  MYCOSTATIN Apply topically 2 (two) times daily.   potassium chloride SA 20 MEQ tablet Commonly known as:  K-DUR,KLOR-CON Take 20 mEq by mouth daily.   ranitidine 75 MG tablet Commonly known as:   ZANTAC Take 75 mg by mouth at bedtime.   venlafaxine 37.5 MG tablet Commonly known as:  EFFEXOR Take 37.5 mg by mouth 2 (two) times daily.   vitamin B-12 1000 MCG tablet Commonly known as:  CYANOCOBALAMIN Take 1,000 mcg by mouth daily.   XARELTO 15 MG Tabs tablet Generic drug:  Rivaroxaban Take 15 mg by mouth daily.            Durable Medical Equipment  (From admission, onward)         Start     Ordered   07/13/18 1029  For home use only DME Wheelchair electric  Once    Comments:  Dementia , bedbound   07/13/18 1028          Vitals:   07/13/18 0819 07/13/18 1144  BP: (!) 125/97 125/82  Pulse: 69 79  Resp:  18  Temp:  98 F (36.7 C)  SpO2:      Wendy [name]

## 2018-07-13 NOTE — ED Provider Notes (Signed)
[facility] Emergency Department Provider Note   ____________________________________________   First MD Initiated Contact with Patient 07/11/18 1648     (approximate)  I have reviewed the triage vital signs and the nursing notes.   HISTORY  Chief Complaint Hypoglycemia  EM caveat: Dementia limits history  HPI Wendy [name] is a 72 y.o. female history of diabetes, dementia, multiple previous strokes  Here for evaluation of unresponsive episode.  Patient was noted to be hypoglycemic with blood sugar in the 20s.  Given 1 amp of D50 with some improvement.  Some limitation due to dementia.  Patient's husband reports not been eating well for about the last 3 mornings, this morning she did not want to eat we offered her breakfast and this afternoon found her to be less responsive.  They called EMS and understands her blood sugar was low.  He tracks carefully her blood sugars, and is noticed they have been lower over the last 3 to 4 days than normal.  No fevers or chills.  Reports she seems slightly more fatigued than normal.  Patient herself denies complaint at this time other than feeling fatigued.  Denies pain.  No chest pain or trouble breathing.  No cough.    Past Medical History:  Diagnosis Date  . Alzheimer's dementia   . Depression   . Diabetes mellitus without complication (HCC)   . GERD (gastroesophageal reflux disease)   . Hyperlipidemia   . Hypertension   . Seizure disorder (HCC)   . Stroke (HCC)   . Urinary retention   . UTI (urinary tract infection)     Patient Active Problem List   Diagnosis Date Noted  . Hypoglycemia 07/11/2018  . Altered mental status 12/31/2017  . Acute metabolic encephalopathy 12/28/2017  . DIABETES MELLITUS, TYPE II 01/18/2011  . LABYRINTHITIS, ACUTE 01/18/2011  . HYPERTENSION 01/18/2011    Past Surgical History:  Procedure Laterality Date  . ABDOMINAL HYSTERECTOMY    . APPENDECTOMY    . HEMIARTHROPLASTY  SHOULDER FRACTURE    . JOINT REPLACEMENT      Prior to Admission medications   Medication Sig Start Date End Date Taking? Authorizing Provider  acetaminophen (TYLENOL) 325 MG tablet Take 650 mg by mouth 2 (two) times daily as needed for mild pain.   Yes [provider]  amoxicillin (AMOXIL) 250 MG capsule Take 250 mg by mouth daily. 07/01/18  Yes [provider]  atorvastatin (LIPITOR) 40 MG tablet Take 40 mg by mouth daily. 06/16/18  Yes [provider]  cholecalciferol (VITAMIN D) 400 units TABS tablet Take 400 Units by mouth daily.   Yes [provider]  Cod Liver Oil OIL Take 1 tablet by mouth daily.   Yes [provider]  docusate sodium (COLACE) 100 MG capsule Take 100 mg by mouth 2 (two) times daily. 07/01/18  Yes [provider]  donepezil (ARICEPT) 23 MG TABS tablet Take 23 mg by mouth at bedtime.   Yes [provider]  insulin aspart (NOVOLOG) 100 UNIT/ML injection Inject 0-6 Units into the skin 3 (three) times daily before meals. SSI: 70-200 --- 0 units, 201-250 -- 2 units, 251-300 -- 4 units, 301-350 --- 6 units, 351+ -- 8 units   Yes [provider]  lisinopril (PRINIVIL,ZESTRIL) 20 MG tablet Take 20 mg by mouth daily. 07/03/18  Yes [provider]  magnesium oxide (MAG-OX) 400 (241.3 Mg) MG tablet Take 1 tablet by mouth 2 (two) times daily. 07/01/18  Yes [provider]  nystatin cream (MYCOSTATIN) Apply topically 2 (two) times daily. 05/01/18 05/01/19 Yes [provider]  potassium chloride SA (K-DUR,KLOR-CON) 20 MEQ tablet Take 20 mEq by mouth daily. 07/01/18  Yes [provider]  ranitidine (ZANTAC) 75 MG tablet Take 75 mg by mouth at bedtime.   Yes [provider]  venlafaxine (EFFEXOR) 37.5 MG tablet Take 37.5 mg by mouth 2 (two) times daily.   Yes [provider]  vitamin B-12 (CYANOCOBALAMIN) 1000 MCG tablet Take 1,000 mcg by mouth daily.   Yes [provider]  XARELTO 15 MG TABS tablet Take 15 mg by mouth daily. 06/27/18  Yes [provider]  insulin NPH Human (HUMULIN N,NOVOLIN N) 100 UNIT/ML injection Inject 0.15 mLs (15 Units total) into the skin 2 (two) times daily. 07/13/18   Ramonita Lab, MD    Allergies Aspirin  Family History  Problem Relation Age of Onset  . CAD Mother   . Diabetes Father   . CAD Father   . CAD Sister   . Breast cancer Neg Hx     Social History Social History   Tobacco Use  . Smoking status: Never Smoker  . Smokeless tobacco: Never Used  Substance Use Topics  . Alcohol use: No    Frequency: Never  . Drug use: No    Review of Systems  EM caveat: Patient husband does report that she is acting more normal now and seems to be much better after getting blood sugar up   ____________________________________________   PHYSICAL EXAM:  VITAL SIGNS: ED Triage Vitals  Enc Vitals Group     BP 07/11/18 1230 (!) 147/93     Pulse Rate 07/11/18 1230 65     Resp 07/11/18 1530 17     Temp 07/11/18 1254 98.5 F (36.9 C)     Temp Source 07/11/18 1254 Oral     SpO2 07/11/18 1230 96 %     Weight 07/11/18 1255 167 lb 8.8 oz (76 kg)     Height 07/11/18 1255 5\' 2"  (1.575 m)     Head Circumference --      Peak Flow --      Pain Score 07/11/18 1255 0     Pain Loc --      Pain Edu? --      Excl. in GC? --     Constitutional: Alert and oriented to self and husband but not to date.  Chronically ill-appearing but in no distress. Eyes: Conjunctivae are normal. Head: Atraumatic. Nose: No congestion/rhinnorhea. Mouth/Throat: Mucous membranes are moist. Neck: No stridor.   Cardiovascular: Normal rate, regular rhythm. Grossly normal heart sounds.  Good peripheral circulation. Respiratory: Normal respiratory effort.  No retractions. Lungs CTAB. Gastrointestinal: Soft and nontender. No distention. Musculoskeletal: No lower extremity tenderness nor edema.  2-3 out of 5 left lower extremity  strength, also slightly reduced strength over the left arm, in addition 4-5 strength right lower extremity and about 5 out of 5 right upper extremity.  Husband reports is normal and she is known to be weak on her left side from previous stroke. Neurologic:  Normal speech and language. No gross focal neurologic deficits are appreciated.  Skin:  Skin is warm, dry and intact. No rash noted.  Foley catheter in place draining slightly hazy urine Psychiatric: Mood and affect are normal. Speech and behavior are normal.  ____________________________________________   LABS (all labs ordered are listed, but only abnormal results are displayed)  Labs Reviewed  URINE  CULTURE - Abnormal; Notable for the following components:      Result Value   Culture   (*)    Value: >=100,000 COLONIES/mL GRAM NEGATIVE RODS IDENTIFICATION AND SUSCEPTIBILITIES TO FOLLOW Performed at [facility], [address], [place], [place] [address]    All other components within normal limits  GLUCOSE, CAPILLARY - Abnormal; Notable for the following components:   Glucose-Capillary 112 (*)    All other components within normal limits  COMPREHENSIVE METABOLIC PANEL - Abnormal; Notable for the following components:   Glucose, Bld 118 (*)    Albumin 3.3 (*)    All other components within normal limits  CBC - Abnormal; Notable for the following components:   RBC 6.38 (*)    MCV 68.8 (*)    MCH 22.3 (*)    RDW 14.8 (*)    All other components within normal limits  URINALYSIS, COMPLETE (UACMP) WITH MICROSCOPIC - Abnormal; Notable for the following components:   Color, Urine YELLOW (*)    APPearance HAZY (*)    Hgb urine dipstick LARGE (*)    Protein, ur 30 (*)    Leukocytes, UA MODERATE (*)    RBC / HPF >50 (*)    Bacteria, UA FEW (*)    All other components within normal limits  BASIC METABOLIC PANEL - Abnormal; Notable for the following components:   Glucose, Bld 185 (*)    GFR calc non Af Amer 60 (*)    Anion  gap 4 (*)    All other components within normal limits  CBC - Abnormal; Notable for the following components:   RBC 5.99 (*)    MCV 70.0 (*)    MCH 22.5 (*)    RDW 14.9 (*)    All other components within normal limits  HEMOGLOBIN A1C - Abnormal; Notable for the following components:   Hgb A1c MFr Bld 6.7 (*)    All other components within normal limits  GLUCOSE, CAPILLARY - Abnormal; Notable for the following components:   Glucose-Capillary 116 (*)    All other components within normal limits  GLUCOSE, CAPILLARY - Abnormal; Notable for the following components:   Glucose-Capillary 316 (*)    All other components within normal limits  GLUCOSE, CAPILLARY - Abnormal; Notable for the following components:   Glucose-Capillary 316 (*)    All other components within normal limits  GLUCOSE, CAPILLARY - Abnormal; Notable for the following components:   Glucose-Capillary 161 (*)    All other components within normal limits  GLUCOSE, CAPILLARY - Abnormal; Notable for the following components:   Glucose-Capillary 225 (*)    All other components within normal limits  GLUCOSE, CAPILLARY - Abnormal; Notable for the following components:   Glucose-Capillary 151 (*)    All other components within normal limits  GLUCOSE, CAPILLARY - Abnormal; Notable for the following components:   Glucose-Capillary 191 (*)    All other components within normal limits  GLUCOSE, CAPILLARY - Abnormal; Notable for the following components:   Glucose-Capillary 284 (*)    All other components within normal limits  GLUCOSE, CAPILLARY - Abnormal; Notable for the following components:   Glucose-Capillary 175 (*)    All other components within normal limits  TROPONIN I  GLUCOSE, CAPILLARY  GLUCOSE, CAPILLARY   ____________________________________________  EKG  Reviewed and interpreted by me.  Time 1240 Heart rate 65 QRS 100 QTc 416 Normal sinus rhythm with left ventricular hypertrophy, ST elevation is somewhat  notable in V1 and V2,  compared with previous appears no significant change.  No STEMI ____________________________________________  RADIOLOGY    ____________________________________________   PROCEDURES  Procedure(s) performed: None  Procedures  Critical Care performed: No  ____________________________________________   INITIAL IMPRESSION / ASSESSMENT AND PLAN / ED COURSE  Pertinent labs & imaging results that were available during my care of the patient were reviewed by me and considered in my medical decision making (see chart for details).    Clinical Course as of Jul 13 1552  Fri Jul 11, 2018  1556 Glucose now 116, patient is also eating a meal at this time. Clydie Braun (from hospice) has seen, reports patient appears to be at her normal basline, believe discharge with reducsed insulin dose would be an appropriate therapy at this time via our discussionn.   [MQ]    Clinical Course User Index [MQ] Sharyn Creamer, MD   After evaluation, observation the patient continues to feel fatigued, does have evidence of urinary tract infection for which we will exchange her Foley catheter and given her associated hypoglycemia with low blood sugar over the last 3 days will admit for further observation for hypoglycemia, altered mental status and concern for urinary tract infection.  Patient and husband agreeable.  ____________________________________________   FINAL CLINICAL IMPRESSION(S) / ED DIAGNOSES  Final diagnoses:  Altered mental status, unspecified altered mental status type  Urinary tract infection associated with indwelling urethral catheter, initial encounter [facility])      NEW MEDICATIONS STARTED DURING THIS VISIT:  Discharge Medication List as of 07/13/2018 10:33 AM       Note:  This document was prepared using Dragon voice recognition software and may include unintentional dictation errors.     Sharyn Creamer, MD 07/13/18 [phone]

## 2018-07-13 NOTE — Discharge Instructions (Signed)
Continue hospice care at home Follow-up with primary care physician as recommended

## 2018-07-13 NOTE — Care Management (Signed)
Patient to return home with hospice services. RNCM spoke to nurse Burna Mortimer from [facility] and she accepts the patient for resumption of hospice care. [facility] all necessary documents to [facility] Patient to discharge via EMS.

## 2018-07-13 NOTE — Discharge Summary (Signed)
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 Alzheimer's dementia   . Depression   . Diabetes mellitus without complication (HCC)   . GERD (gastroesophageal reflux disease)   . Hyperlipidemia   . Hypertension   . Seizure disorder (HCC)   . Stroke (HCC)   . Urinary retention   . UTI (urinary tract infection)     HOSPITAL COURSE:   HPI Wendy [name]  is a 72 y.o. female sent in by hospice for altered mental status and hypoglycemic episode this morning.  Patient able to answer only a few yes or no questions.  She was busy eating when I came to evaluate her.  History obtained from husband at the bedside.  She has had a couple episodes of sweating at home.  She has a catheter was placed for comfort and keeping her dry.  Husband stated it was changed last Saturday.  Patient's been having some intermittent low sugars.  Patient unable to give any history.  Dr. Fanny Bien asked to admit for urinary tract infection but I told him that I just change the catheter if she is not septic.  Then he asked to observe for hypoglycemia.  Marland Kitchen1 Hypoglycemic episodes.  Improved  Decreased NPH insulin to 15 units, titrate as needed Continue close monitoring of her sugars  2. Complication of urinary catheter likely with colonization.   Urine culture is revealing greater than 100,000  colonies of gram-negative rods but patient is afebrile and no leukocytosis No need to treat urinary tract infections from catheters unless they are septic. Patient is not septic at this point.  The indwelling Foley catheter is replaced with a new one in the emergency department Discussed with on-call infectious disease physician Dr. Ninetta Lights who is agreeable with the above plan no need of antibiotics Continue amoxicillin 250 mg p.o. once daily for prophylaxis which was started by the primary care physician at his  discretion  3. Stroke history on Xarelto  4. Hypertension on lisinopril  5. GERD on Zantac  6. Dementia and depression on Effexor and Aricept  7. Hyperlipidemia unspecified on atorvastatin  Continue hospice care at home.  Husband takes care of her We will try to arrange electric wheelchair discussed with case manager DISCHARGE CONDITIONS:   fair  CONSULTS OBTAINED:     PROCEDURES  None   DRUG ALLERGIES:   Allergies  Allergen Reactions  . Aspirin Other (See Comments)    Heartburn     DISCHARGE MEDICATIONS:   Allergies as of 07/13/2018      Reactions   Aspirin Other (See Comments)   Heartburn      Medication List    TAKE these medications   acetaminophen 325 MG tablet Commonly known as:  TYLENOL Take 650 mg by mouth 2 (two) times daily as needed for mild pain.   amoxicillin 250 MG capsule Commonly known as:  AMOXIL Take 250 mg by mouth daily.  atorvastatin 40 MG tablet Commonly known as:  LIPITOR Take 40 mg by mouth daily.   cholecalciferol 400 units Tabs tablet Commonly known as:  VITAMIN D Take 400 Units by mouth daily.   Cod Liver Oil Oil Take 1 tablet by mouth daily.   docusate sodium 100 MG capsule Commonly known as:  COLACE Take 100 mg by mouth 2 (two) times daily.   donepezil 23 MG Tabs tablet Commonly known as:  ARICEPT Take 23 mg by mouth at bedtime.   insulin aspart 100 UNIT/ML injection Commonly known as:   novoLOG Inject 0-6 Units into the skin 3 (three) times daily before meals. SSI: 70-200 --- 0 units, 201-250 -- 2 units, 251-300 -- 4 units, 301-350 --- 6 units, 351+ -- 8 units   insulin NPH Human 100 UNIT/ML injection Commonly known as:  HUMULIN N,NOVOLIN N Inject 0.15 mLs (15 Units total) into the skin 2 (two) times daily. What changed:  how much to take   lisinopril 20 MG tablet Commonly known as:  PRINIVIL,ZESTRIL Take 20 mg by mouth daily.   magnesium oxide 400 (241.3 Mg) MG tablet Commonly known as:  MAG-OX Take 1 tablet by mouth 2 (two) times daily.   nystatin cream Commonly known as:  MYCOSTATIN Apply topically 2 (two) times daily.   potassium chloride SA 20 MEQ tablet Commonly known as:  K-DUR,KLOR-CON Take 20 mEq by mouth daily.   ranitidine 75 MG tablet Commonly known as:  ZANTAC Take 75 mg by mouth at bedtime.   venlafaxine 37.5 MG tablet Commonly known as:  EFFEXOR Take 37.5 mg by mouth 2 (two) times daily.   vitamin B-12 1000 MCG tablet Commonly known as:  CYANOCOBALAMIN Take 1,000 mcg by mouth daily.   XARELTO 15 MG Tabs tablet Generic drug:  Rivaroxaban Take 15 mg by mouth daily.        DISCHARGE INSTRUCTIONS:   Continue hospice care at home Follow-up with primary care physician as recommended  DIET:  Regular diet  DISCHARGE CONDITION:  Fair  ACTIVITY:  Bedrest  OXYGEN:  Home Oxygen: No.   Oxygen Delivery: room air  DISCHARGE LOCATION:  home   If you experience worsening of your admission symptoms, develop shortness of breath, life threatening emergency, suicidal or homicidal thoughts you must seek medical attention immediately by calling 911 or calling your MD immediately  if symptoms less severe.  You Must read complete instructions/literature along with all the possible adverse reactions/side effects for all the Medicines you take and that have been prescribed to you. Take any new Medicines after you have completely understood and  accpet all the possible adverse reactions/side effects.   Please note  You were cared for by a hospitalist during your hospital stay. If you have any questions about your discharge medications or the care you received while you were in the hospital after you are discharged, you can call the unit and asked to speak with the hospitalist on call if the hospitalist that took care of you is not available. Once you are discharged, your primary care physician will handle any further medical issues. Please note that NO REFILLS for any discharge medications will be authorized once you are discharged, as it is imperative that you return to your primary care physician (or establish a relationship with a primary care physician if you do not have one) for your aftercare needs so that they can reassess your need for medications and monitor your lab values.     Today  Chief  Complaint  Patient presents with  . Hypoglycemia   Patient is bedbound with chronic dementia.  Needs total care.  Continue hospice care at home.  Husband at bedside and provides total care at home  ROS:  Unobtainable  VITAL SIGNS:  Blood pressure (!) 125/97, pulse 69, temperature 98.1 F (36.7 C), temperature source Oral, resp. rate 16, height 5\' 2"  (1.575 m), weight 76 kg, SpO2 97 %.  I/O:    Intake/Output Summary (Last 24 hours) at 07/13/2018 1026 Last data filed at 07/13/2018 0635 Gross per 24 hour  Intake -  Output 1400 ml  Net -1400 ml    PHYSICAL EXAMINATION:  GENERAL:  72 y.o.-year-old patient lying in the bed with no acute distress.  EYES: Pupils equal, round, reactive to light and accommodation. No scleral icterus. Extraocular muscles intact.  HEENT: Head atraumatic, normocephalic. Oropharynx and nasopharynx clear.  NECK:  Supple, no jugular venous distention. No thyroid enlargement, no tenderness.  LUNGS: Normal breath sounds bilaterally, no wheezing, rales,rhonchi or crepitation. No use of accessory muscles of  respiration.  CARDIOVASCULAR: S1, S2 normal. No murmurs, rubs, or gallops.  ABDOMEN: Soft, non-tender, non-distended. Bowel sounds present.  EXTREMITIES: No pedal edema, cyanosis, or clubbing.  NEUROLOGIC: Awake and alert but disoriented. Gait not checked.  PSYCHIATRIC: The patient is alert  SKIN: No obvious rash, lesion  DATA REVIEW:   CBC Recent Labs  Lab 07/12/18 0515  WBC 7.1  HGB 13.5  HCT 41.9  PLT 152    Chemistries  Recent Labs  Lab 07/11/18 1239 07/12/18 0515  NA 138 138  K 3.9 4.4  CL 104 106  CO2 29 28  GLUCOSE 118* 185*  BUN 12 13  CREATININE 0.93 0.95  CALCIUM 9.3 9.0  AST 33  --   ALT 27  --   ALKPHOS 60  --   BILITOT 0.7  --     Cardiac Enzymes Recent Labs  Lab 07/11/18 1239  TROPONINI <0.03    Microbiology Results  Results for orders placed or performed during the hospital encounter of 07/11/18  Urine Culture     Status: Abnormal (Preliminary result)   Collection Time: 07/11/18  3:00 PM  Result Value Ref Range Status   Specimen Description   Final    URINE, CATHETERIZED Performed at [facility], [address] [place], [place] [address]    Special Requests   Final    Normal Performed at [facility], [address] [place], [place] [address]    Culture (A)  Final    >=100,000 COLONIES/mL GRAM NEGATIVE RODS IDENTIFICATION AND SUSCEPTIBILITIES TO FOLLOW Performed at [facility], [address], [place], [place] [address]    Report Status PENDING  Incomplete    RADIOLOGY:  Ct Head Wo Contrast  Result Date: 07/11/2018 CLINICAL DATA:  Altered mental status starting this morning EXAM: CT HEAD WITHOUT CONTRAST TECHNIQUE: Contiguous axial images were obtained from the base of the skull through the vertex without intravenous contrast. COMPARISON:  12/31/2017 FINDINGS: Brain: Remote infarcts involving the basal ganglia right greater than left, and anterior right thalamus noted. There is some mild hypodensity in  the right pons on image 10/4 which appears to have been present on prior MRI brain and probably a lacunar infarct. Periventricular white matter and corona radiata hypodensities favor chronic ischemic microvascular white matter disease. No intracranial hemorrhage, mass lesion, or acute CVA. Accentuated density along the right posterior cavernous sinus region on image 9/4 appears to be due  to tortuous internal carotid artery, for example comparing back to the MRI from 07/22/2012. Vascular: There is atherosclerotic calcification of the cavernous carotid arteries bilaterally. Skull: Unremarkable Sinuses/Orbits: Minimal chronic right ethmoid sinusitis. Other: No supplemental non-categorized findings. IMPRESSION: 1. Chronic infarcts involving the basal ganglia and pons, without acute intracranial findings. 2. Periventricular white matter and corona radiata hypodensities favor chronic ischemic microvascular white matter disease. 3. Mild chronic right ethmoid sinusitis. 4. Atherosclerosis. Electronically Signed   By: Gaylyn Rong M.D.   On: 07/11/2018 14:23   Dg Chest Portable 1 View  Result Date: 07/11/2018 CLINICAL DATA:  Pt arrives from home via [facility]. Pt spouse stated pt AMS this morning, Pt is alert to baseline, post stroke, poor historian. EXAM: PORTABLE CHEST 1 VIEW COMPARISON:  05/01/2017 FINDINGS: Heart size is normal. There is mild tortuosity and focal calcification within the thoracic aorta. The lungs are clear. No pulmonary edema. Surgical clips are present in the RIGHT UPPER QUADRANT the abdomen. IMPRESSION: No evidence for acute abnormality. Aortic atherosclerosis. (ICD10-I70.0) Electronically Signed   By: Norva Pavlov M.D.   On: 07/11/2018 14:30    EKG:   Orders placed or performed during the hospital encounter of 07/11/18  . EKG 12-Lead  . EKG 12-Lead      Management plans discussed with the patient, family and they are in agreement.  CODE STATUS:     Code Status Orders  (From  admission, onward)         Start     Ordered   07/11/18 1710  Full code  Continuous     07/11/18 1709        Code Status History    Date Active Date Inactive Code Status Order ID Comments User Context   12/31/2017 1856 01/02/2018 1802 Full Code [id number]  Ihor Austin, MD Inpatient   12/28/2017 0923 12/30/2017 1647 Full Code [id number]  Shaune Pollack, MD Inpatient    Advance Directive Documentation     Most Recent Value  Type of Advance Directive  Living will  Pre-existing out of facility DNR order (yellow form or pink MOST form)  -  "MOST" Form in Place?  -      TOTAL TIME TAKING CARE OF THIS PATIENT: 45  minutes.   Note: This dictation was prepared with Dragon dictation along with smaller phrase technology. Any transcriptional errors that result from this process are unintentional.   @M[facility] @  on 07/13/2018 at 10:26 AM  Between 7am to 6pm - Pager - [phone]  After 6pm go to [email or web address] - password [email or web address] [facility]  [place] [facility]  Office  [phone]  CC: Primary care physician; Leim Fabry, MD

## 2018-07-14 LAB — URINE CULTURE: Special Requests: NORMAL

## 2018-08-02 ENCOUNTER — Emergency Department (HOSPITAL_COMMUNITY)
Admission: EM | Admit: 2018-08-02 | Discharge: 2018-08-02 | Disposition: A | Discharge status: Home / Self Care | Attending: Emergency Medicine | Admitting: Emergency Medicine

## 2018-08-02 ENCOUNTER — Other Ambulatory Visit: Payer: Self-pay

## 2018-08-02 ENCOUNTER — Emergency Department (HOSPITAL_COMMUNITY)

## 2018-08-02 DIAGNOSIS — Z79899 Other long term (current) drug therapy: Secondary | ICD-10-CM | POA: Insufficient documentation

## 2018-08-02 DIAGNOSIS — R4182 Altered mental status, unspecified: Secondary | ICD-10-CM | POA: Insufficient documentation

## 2018-08-02 DIAGNOSIS — R410 Disorientation, unspecified: Secondary | ICD-10-CM

## 2018-08-02 DIAGNOSIS — G309 Alzheimer's disease, unspecified: Secondary | ICD-10-CM | POA: Insufficient documentation

## 2018-08-02 DIAGNOSIS — R150 Incomplete defecation: Secondary | ICD-10-CM | POA: Diagnosis not present

## 2018-08-02 DIAGNOSIS — I10 Essential (primary) hypertension: Secondary | ICD-10-CM | POA: Diagnosis not present

## 2018-08-02 DIAGNOSIS — Z794 Long term (current) use of insulin: Secondary | ICD-10-CM | POA: Insufficient documentation

## 2018-08-02 DIAGNOSIS — Z96619 Presence of unspecified artificial shoulder joint: Secondary | ICD-10-CM | POA: Insufficient documentation

## 2018-08-02 DIAGNOSIS — E119 Type 2 diabetes mellitus without complications: Secondary | ICD-10-CM | POA: Insufficient documentation

## 2018-08-02 DIAGNOSIS — R531 Weakness: Secondary | ICD-10-CM

## 2018-08-02 DIAGNOSIS — R55 Syncope and collapse: Secondary | ICD-10-CM

## 2018-08-02 DIAGNOSIS — Z7901 Long term (current) use of anticoagulants: Secondary | ICD-10-CM | POA: Insufficient documentation

## 2018-08-02 DIAGNOSIS — F0280 Dementia in other diseases classified elsewhere without behavioral disturbance: Secondary | ICD-10-CM | POA: Insufficient documentation

## 2018-08-02 LAB — DIFFERENTIAL
Abs Immature Granulocytes: 0 10*3/uL (ref 0.0–0.1)
Basophils Absolute: 0 10*3/uL (ref 0.0–0.1)
Basophils Relative: 1 %
EOS PCT: 0 %
Eosinophils Absolute: 0 10*3/uL (ref 0.0–0.7)
Immature Granulocytes: 0 %
LYMPHS PCT: 40 %
Lymphs Abs: 1.9 10*3/uL (ref 0.7–4.0)
Monocytes Absolute: 0.5 10*3/uL (ref 0.1–1.0)
Monocytes Relative: 11 %
NEUTROS PCT: 48 %
Neutro Abs: 2.3 10*3/uL (ref 1.7–7.7)

## 2018-08-02 LAB — COMPREHENSIVE METABOLIC PANEL
ALBUMIN: 3 g/dL — AB (ref 3.5–5.0)
ALK PHOS: 70 U/L (ref 38–126)
ALT: 46 U/L — ABNORMAL HIGH (ref 0–44)
AST: 62 U/L — AB (ref 15–41)
Anion gap: 6 (ref 5–15)
BUN: 11 mg/dL (ref 8–23)
CALCIUM: 9.4 mg/dL (ref 8.9–10.3)
CO2: 28 mmol/L (ref 22–32)
CREATININE: 1.1 mg/dL — AB (ref 0.44–1.00)
Chloride: 102 mmol/L (ref 98–111)
GFR calc Af Amer: 58 mL/min — ABNORMAL LOW (ref >60)
GFR calc non Af Amer: 50 mL/min — ABNORMAL LOW (ref >60)
GLUCOSE: 116 mg/dL — AB (ref 70–99)
Potassium: 6.1 mmol/L — ABNORMAL HIGH (ref 3.5–5.1)
Sodium: 136 mmol/L (ref 135–145)
TOTAL PROTEIN: 6.2 g/dL — AB (ref 6.5–8.1)

## 2018-08-02 LAB — CBC
HCT: 46.8 % — ABNORMAL HIGH (ref 36.0–46.0)
HEMOGLOBIN: 14.4 g/dL (ref 12.0–15.0)
MCH: 21.7 pg — AB (ref 26.0–34.0)
MCHC: 30.8 g/dL (ref 30.0–36.0)
MCV: 70.4 fL — AB (ref 78.0–100.0)
PLATELETS: 160 10*3/uL (ref 150–400)
RBC: 6.65 MIL/uL — AB (ref 3.87–5.11)
RDW: 15.9 % — AB (ref 11.5–15.5)
WBC: 4.8 10*3/uL (ref 4.0–10.5)

## 2018-08-02 LAB — PROTIME-INR
INR: 1.61
Prothrombin Time: 19 seconds — ABNORMAL HIGH (ref 11.4–15.2)

## 2018-08-02 LAB — I-STAT CHEM 8, ED
BUN: 16 mg/dL (ref 8–23)
CHLORIDE: 103 mmol/L (ref 98–111)
CREATININE: 1 mg/dL (ref 0.44–1.00)
Calcium, Ion: 1.16 mmol/L (ref 1.15–1.40)
GLUCOSE: 116 mg/dL — AB (ref 70–99)
HCT: 49 % — ABNORMAL HIGH (ref 36.0–46.0)
Hemoglobin: 16.7 g/dL — ABNORMAL HIGH (ref 12.0–15.0)
POTASSIUM: 6.8 mmol/L — AB (ref 3.5–5.1)
Sodium: 137 mmol/L (ref 135–145)
TCO2: 32 mmol/L (ref 22–32)

## 2018-08-02 LAB — APTT: aPTT: 37 seconds — ABNORMAL HIGH (ref 24–36)

## 2018-08-02 LAB — I-STAT TROPONIN, ED: Troponin i, poc: 0 ng/mL (ref 0.00–0.08)

## 2018-08-02 LAB — CBG MONITORING, ED: Glucose-Capillary: 115 mg/dL — ABNORMAL HIGH (ref 70–99)

## 2018-08-02 LAB — POTASSIUM: Potassium: 4 mmol/L (ref 3.5–5.1)

## 2018-08-02 MED ORDER — CEPHALEXIN 500 MG PO CAPS: 500.0000 mg | ORAL_CAPSULE | Freq: Three times a day (TID) | ORAL | 0 refills | Status: AC

## 2018-08-02 MED ORDER — SODIUM CHLORIDE 0.9 % IV SOLN
1.0000 g | Freq: Once | INTRAVENOUS | Status: AC
  Administered 2018-08-02: 1 g via INTRAVENOUS
  Filled 2018-08-02: qty 10

## 2018-08-02 NOTE — Progress Notes (Signed)
Code stroke called on 72 y.o female per EMS. Pt with witnessed episode of unresponsiveness during a bath at home, EMS called to home. Upon their arrival Pt minimally responsive to pain initially with improving  left side weakness. CBG 106. Past medical history includes Stroke (2013), Seizures, HTN, HLD, DM, alzheimer's. CT scan completed and negative for acute bleed per Neurologist Dr.Lindzen. NIHSS completed yielding 5 for  confusion, mild weakness in left arm and bilateral legs. Per Epic Medical records Pt taking xarelto. Not a TPA candidate, Pt for AMS work up.

## 2018-08-02 NOTE — ED Provider Notes (Addendum)
[facility] EMERGENCY DEPARTMENT Provider Note   CSN: [id number] Arrival date & time: 08/02/18  1157     History   Chief Complaint Chief Complaint  Patient presents with  . Code Stroke   Level 5 caveat: Dementia  HPI Wendy [name] is a 72 y.o. female.  HPI Patient is a 72 year old female who is brought to the emergency department with decreased level of consciousness and possible left-sided weakness for EMS.  On arrival the emergency department she has no focal unilateral arm or leg weakness.  She seems somewhat sleepy.  She has a history of Alzheimer's dementia as well as a history of prior TIA/stroke.  She is a type II diabetic.  She has a chronic indwelling catheter.  She has a history of diabetes, hypertension, hyperlipidemia.  Her medical chart reports a history of seizure disorder but family reports that she has been found by the [facility] team did not have seizures.  Patient is currently involved with the hospice team and receives in-home home health for her dementia and generalized weakness.  Family reports that she has had some sleepiness over the past several days and reports that this is been an intermittent issue over the past year.  Patient at this time reports that she is hungry and feels like she might need to have a bowel movement.  She denies chest pain.  She denies abdominal pain.   Past Medical History:  Diagnosis Date  . Alzheimer's dementia   . Depression   . Diabetes mellitus without complication (HCC)   . GERD (gastroesophageal reflux disease)   . Hyperlipidemia   . Hypertension   . Seizure disorder (HCC)   . Stroke (HCC)   . Urinary retention   . UTI (urinary tract infection)     Patient Active Problem List   Diagnosis Date Noted  . Hypoglycemia 07/11/2018  . Altered mental status 12/31/2017  . Acute metabolic encephalopathy 12/28/2017  . DIABETES MELLITUS, TYPE II 01/18/2011  . LABYRINTHITIS, ACUTE 01/18/2011  . HYPERTENSION  01/18/2011    Past Surgical History:  Procedure Laterality Date  . ABDOMINAL HYSTERECTOMY    . APPENDECTOMY    . HEMIARTHROPLASTY SHOULDER FRACTURE    . JOINT REPLACEMENT       OB History   None      Home Medications    Prior to Admission medications   Medication Sig Start Date End Date Taking? Authorizing Provider  acetaminophen (TYLENOL) 325 MG tablet Take 650 mg by mouth 2 (two) times daily as needed for mild pain.    [provider]  amoxicillin (AMOXIL) 250 MG capsule Take 250 mg by mouth daily. 07/01/18   [provider]  atorvastatin (LIPITOR) 40 MG tablet Take 40 mg by mouth daily. 06/16/18   [provider]  cephALEXin (KEFLEX) 500 MG capsule Take 1 capsule (500 mg total) by mouth 3 (three) times daily. 08/02/18   Azalia Bilis, MD  cholecalciferol (VITAMIN D) 400 units TABS tablet Take 400 Units by mouth daily.    [provider]  [facility] Hights Liver Oil OIL Take 1 tablet by mouth daily.    [provider]  docusate sodium (COLACE) 100 MG capsule Take 100 mg by mouth 2 (two) times daily. 07/01/18   [provider]  donepezil (ARICEPT) 23 MG TABS tablet Take 23 mg by mouth at bedtime.    [provider]  insulin aspart (NOVOLOG) 100 UNIT/ML injection Inject 0-6 Units into the skin 3 (three) times daily  before meals. SSI: 70-200 --- 0 units, 201-250 -- 2 units, 251-300 -- 4 units, 301-350 --- 6 units, 351+ -- 8 units    [provider]  insulin NPH Human (HUMULIN N,NOVOLIN N) 100 UNIT/ML injection Inject 0.15 mLs (15 Units total) into the skin 2 (two) times daily. 07/13/18   Gouru, Deanna Artis, MD  lisinopril (PRINIVIL,ZESTRIL) 20 MG tablet Take 20 mg by mouth daily. 07/03/18   [provider]  magnesium oxide (MAG-OX) 400 (241.3 Mg) MG tablet Take 1 tablet by mouth 2 (two) times daily. 07/01/18   [provider]  nystatin cream (MYCOSTATIN) Apply topically 2 (two) times daily. 05/01/18 05/01/19  [provider]  potassium chloride SA (K-DUR,KLOR-CON) 20 MEQ tablet Take 20 mEq by mouth daily. 07/01/18   [provider]  ranitidine (ZANTAC) 75 MG tablet Take 75 mg by mouth at bedtime.    [provider]  venlafaxine (EFFEXOR) 37.5 MG tablet Take 37.5 mg by mouth 2 (two) times daily.    [provider]  vitamin B-12 (CYANOCOBALAMIN) 1000 MCG tablet Take 1,000 mcg by mouth daily.    [provider]  XARELTO 15 MG TABS tablet Take 15 mg by mouth daily. 06/27/18   [provider]    Family History Family History  Problem Relation Age of Onset  . CAD Mother   . Diabetes Father   . CAD Father   . CAD Sister   . Breast cancer Neg Hx     Social History Social History   Tobacco Use  . Smoking status: Never Smoker  . Smokeless tobacco: Never Used  Substance Use Topics  . Alcohol use: No    Frequency: Never  . Drug use: No     Allergies   Aspirin   Review of Systems Review of Systems  Unable to perform ROS: Dementia     Physical Exam Updated Vital Signs BP (!) 145/82   Pulse 61   Ht 5\' 2"  (1.575 m)   Wt 73.3 kg   SpO2 97%   BMI 29.56 kg/m   Physical Exam  Constitutional: She appears well-developed and well-nourished. No distress.  HENT:  Head: Normocephalic and atraumatic.  Eyes: Pupils are equal, round, and reactive to light. EOM are normal.  Neck: Normal range of motion.  Cardiovascular: Normal rate, regular rhythm and normal heart sounds.  Pulmonary/Chest: Effort normal and breath sounds normal.  Abdominal: Soft. She exhibits no distension. There is no tenderness.  Musculoskeletal: Normal range of motion.  Neurological: She is alert.  5/5 strength in major muscle groups of  bilateral upper and lower extremities. Speech normal. No facial asymetry.  No dysarthria.  Soft sounding speech.  Follows commands.  Skin: Skin is warm and dry.  Psychiatric:  Speaks softly but in complete sentences when spoken to.   Nursing  note and vitals reviewed.    ED Treatments / Results  Labs (all labs ordered are listed, but only abnormal results are displayed) Labs Reviewed  PROTIME-INR - Abnormal; Notable for the following components:      Result Value   Prothrombin Time 19.0 (*)    All other components within normal limits  APTT - Abnormal; Notable for the following components:   aPTT 37 (*)    All other components within normal limits  CBC - Abnormal; Notable for the following components:   RBC 6.65 (*)    HCT 46.8 (*)    MCV 70.4 (*)    MCH 21.7 (*)  RDW 15.9 (*)    All other components within normal limits  COMPREHENSIVE METABOLIC PANEL - Abnormal; Notable for the following components:   Potassium 6.1 (*)    Glucose, Bld 116 (*)    Creatinine, Ser 1.10 (*)    Total Protein 6.2 (*)    Albumin 3.0 (*)    AST 62 (*)    ALT 46 (*)    Total Bilirubin <0.1 (*)    GFR calc non Af Amer 50 (*)    GFR calc Af Amer 58 (*)    All other components within normal limits  CBG MONITORING, ED - Abnormal; Notable for the following components:   Glucose-Capillary 115 (*)    All other components within normal limits  I-STAT CHEM 8, ED - Abnormal; Notable for the following components:   Potassium 6.8 (*)    Glucose, Bld 116 (*)    Hemoglobin 16.7 (*)    HCT 49.0 (*)    All other components within normal limits  URINE CULTURE  DIFFERENTIAL  POTASSIUM  I-STAT TROPONIN, ED    EKG EKG Interpretation  Date/Time:  Saturday August 02 2018 12:20:58 EDT Ventricular Rate:  61 PR Interval:    QRS Duration: 99 QT Interval:  457 QTC Calculation: 461 R Axis:   43 Text Interpretation:  Sinus rhythm Probable LVH with secondary repol abnrm Anterior Q waves, possibly due to LVH No significant change was found Confirmed by Azalia Bilis ([id number]) on 08/02/2018 3:40:59 PM   Radiology Ct Head Code Stroke Wo Contrast  Result Date: 08/02/2018 CLINICAL DATA:  Code stroke. LEFT-sided weakness. Unresponsive. Dementia.  EXAM: CT HEAD WITHOUT CONTRAST TECHNIQUE: Contiguous axial images were obtained from the base of the skull through the vertex without intravenous contrast. COMPARISON:  07/11/2018. FINDINGS: Brain: No evidence of acute infarction, hemorrhage, extra-axial collection or mass lesion/mass effect. Advanced atrophy. Hydrocephalus ex vacuo. Hypoattenuation of white matter, consistent with small vessel disease. Chronic infarcts involve the basal ganglia and pons, but no cortical hypoattenuation appears acute. Marked brainstem atrophy. Punctate area of brainstem calcification, stable. Vascular: Calcification of the cavernous internal carotid arteries and distal vertebral arteries consistent with cerebrovascular atherosclerotic disease. No signs of intracranial large vessel occlusion. Skull: Normal. Negative for fracture or focal lesion. Sinuses/Orbits: No acute finding. Other: None. ASPECTS [facility] Stroke Program Early CT Score) - Ganglionic level infarction (caudate, lentiform nuclei, internal capsule, insula, M1-M3 cortex): 7 - Supraganglionic infarction (M4-M6 cortex): 3 Total score (0-10 with 10 being normal): 10 IMPRESSION: 1. Atrophy and small vessel disease. Remote deep white matter infarcts. No acute findings are evident. Calcific cerebrovascular atherosclerotic disease. 2. ASPECTS is 10. These results were communicated to Dr. Otelia Limes at 12:19 pmon 9/28/2019by text page via the [facility] messaging system. Electronically Signed   By: Elsie Stain M.D.   On: 08/02/2018 12:21    Procedures Procedures (including critical care time)  Medications Ordered in ED Medications  cefTRIAXone (ROCEPHIN) 1 g in sodium chloride 0.9 % 100 mL IVPB (has no administration in time range)     Initial Impression / Assessment and Plan / ED Course  I have reviewed the triage vital signs and the nursing notes.  Pertinent labs & imaging results that were available during my care of the patient were reviewed by me and considered in  my medical decision making (see chart for details).     Work-up in the emergency department is without significant abnormality.  She follows commands.  She answers all questions.  She is requesting something  to eat at this time.  There is a chronic component of this per family reports that she has had intermittent altered mental status over the past year.  I believe with her dementia this is why hospice has been involved.  She is requiring increasing assistance at home.  At this time I do not think she needs acute hospitalization.  She is stable for discharge from the emergency department to follow-up with her primary care team in [facility] as well as her home hospice team.  There is an indwelling catheter and family reports that sometimes when she acts like that she has a urinary tract infection.  Will be difficult to tell based on a chronically catheterized individual therefore they would feel better if we initiate antibiotics here in the emergency department.  Given there knowledge of the patient and her prior medical care think this is reasonable.  She will be given a dose of Rocephin in the emergency department and discharged home with Keflex with her primary care team to follow-up on the urine culture and stop antibiotics when urine culture is negative.  Family is encouraged to return to the ER for new or worsening symptoms  Final Clinical Impressions(s) / ED Diagnoses   Final diagnoses:  Generalized weakness    ED Discharge Orders         Ordered    cephALEXin (KEFLEX) 500 MG capsule  3 times daily     08/02/18 1609           Azalia Bilis, MD 08/02/18 1616    Azalia Bilis, MD 08/02/18 [id number]

## 2018-08-02 NOTE — ED Notes (Signed)
Pt was incontinent of small amount of stool. Peri care done and brief placed.

## 2018-08-02 NOTE — Consult Note (Addendum)
NEURO HOSPITALIST  CONSULT     Requesting Physician: Dr. Patria Mane    Chief Complaint: Unresponsive with left sided weakness  History obtained from:  Chart  HPI:                                                                                                                                         Wendy [name] is a 72 y.o. female with a PMH of stroke (2013), HTN, HLD, seizures, DM and Alzheimer's disease who presented to the [facility] ED as a code stroke for unresponsiveness and left sided weakness.    Per EMS the family was giving patient a bath today at 11:00 when she suddenly became unresponsive. When EMS arrived they said she was still not responding and noticed that her left side was weak. As they were en route to hospital she became more responsive. No jerking or twitching motions were noted. Patient lives at home with her husband and son per EMS report.  ED course:  CT head: Atrophy and small vessel disease. Remote deep white matter infarcts. No acute findings are evident. Calcific cerebrovascular atherosclerotic disease BG: 106 BP: 151/95  Per [facility] chart stroke 2013. Per EMS report patient has residual left side deficits. 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  Date last known well: Date: 08/02/2018 Time last known well: Time: 11:00 tPA Given: No: on xarelto No Symptoms    Modified Rankin: Rankin Score=4 NIHSS:5   Past Medical History:  Diagnosis Date  . Alzheimer's dementia   . Depression   . Diabetes mellitus without complication (HCC)   . GERD (gastroesophageal reflux disease)   . Hyperlipidemia   . Hypertension   . Seizure disorder (HCC)   . Stroke (HCC)   . Urinary retention   . UTI (urinary tract infection)     Past Surgical History:  Procedure Laterality Date  . ABDOMINAL HYSTERECTOMY    . APPENDECTOMY    . HEMIARTHROPLASTY SHOULDER FRACTURE    . JOINT REPLACEMENT      Family History  Problem Relation Age of Onset   . CAD Mother   . Diabetes Father   . CAD Father   . CAD Sister   . Breast cancer Neg Hx          Social History:  reports that she has never smoked. She has never used smokeless tobacco. She reports that she does not drink alcohol or use drugs.  Allergies:  Allergies  Allergen Reactions  . Aspirin Other (See Comments)    Heartburn     Medications:  No current facility-administered medications for this encounter.    Current Outpatient Medications  Medication Sig Dispense Refill  . acetaminophen (TYLENOL) 325 MG tablet Take 650 mg by mouth 2 (two) times daily as needed for mild pain.    Marland Kitchen amoxicillin (AMOXIL) 250 MG capsule Take 250 mg by mouth daily.  2  . atorvastatin (LIPITOR) 40 MG tablet Take 40 mg by mouth daily.    . cholecalciferol (VITAMIN D) 400 units TABS tablet Take 400 Units by mouth daily.    . Cod Liver Oil OIL Take 1 tablet by mouth daily.    Marland Kitchen docusate sodium (COLACE) 100 MG capsule Take 100 mg by mouth 2 (two) times daily.  3  . donepezil (ARICEPT) 23 MG TABS tablet Take 23 mg by mouth at bedtime.    . insulin aspart (NOVOLOG) 100 UNIT/ML injection Inject 0-6 Units into the skin 3 (three) times daily before meals. SSI: 70-200 --- 0 units, 201-250 -- 2 units, 251-300 -- 4 units, 301-350 --- 6 units, 351+ -- 8 units    . insulin NPH Human (HUMULIN N,NOVOLIN N) 100 UNIT/ML injection Inject 0.15 mLs (15 Units total) into the skin 2 (two) times daily. 10 mL 11  . lisinopril (PRINIVIL,ZESTRIL) 20 MG tablet Take 20 mg by mouth daily.    . magnesium oxide (MAG-OX) 400 (241.3 Mg) MG tablet Take 1 tablet by mouth 2 (two) times daily.  3  . nystatin cream (MYCOSTATIN) Apply topically 2 (two) times daily.    . potassium chloride SA (K-DUR,KLOR-CON) 20 MEQ tablet Take 20 mEq by mouth daily.  3  . ranitidine (ZANTAC) 75 MG tablet Take 75 mg by mouth at  bedtime.    Marland Kitchen venlafaxine (EFFEXOR) 37.5 MG tablet Take 37.5 mg by mouth 2 (two) times daily.    . vitamin B-12 (CYANOCOBALAMIN) 1000 MCG tablet Take 1,000 mcg by mouth daily.    Carlena Hurl 15 MG TABS tablet Take 15 mg by mouth daily.  11    ROS:                                                                                                                                        unobtainable from patient due to mental status   General Examination:                                                                                                      Height 5\' 2"  (1.575 m), weight 73.3 kg.  HEENT-  Normocephalic, no lesions, without obvious abnormality.  Normal external eye and conjunctiva.  Cardiovascular- S1-S2 audible, pulses palpable throughout   Lungs-no rhonchi or wheezing noted, no excessive working breathing.  Saturations within normal limits on 2 L [place] Abdomen- All 4 quadrants palpated and nontender Extremities- Warm, dry and intact Musculoskeletal-no joint tenderness, deformity or swelling Skin-warm and dry, no hyperpigmentation, vitiligo, or suspicious lesions  Neurological Examination Mental Status: Alert, oriented person only.  Speech is sparse and hypophonic but without evidence of a focal lesional aphasia.  Able to follow some commands without difficulty ( wiggle toes, squeeze my hand, raise your arms). Able to answer some questions with short 1-3 word answers. Appears anxious and confused. Poor attention.  Cranial Nerves: II: Blinks to bilateral confrontation. PERRL III,IV, VI: ptosis not present, no nystagmus, EOMI.   V,VII: smile symmetric, facial light touch/ temperature sensation normal bilaterally VIII: hearing normal bilaterally IX,X: uvula rises symmetrically XI: bilateral shoulder shrug XII: midline tongue extension Motor: Left foot is externally rotated;  LLE 4+/5 , RLE 5/5,. LUE 4-/5 biceps and no hand grip, RUE 5/5 biceps. Tone and bulk: Normal tone throughout;  no atrophy noted Sensory:  Cool temperature and light touch sensation intact in all 4 extremities.  Deep Tendon Reflexes: Bilateral achilles 0, 2+ patella right, 1+ patella left, right bicep 3+ and muted on left bicep. Plantars: Right: downgoing   Left: downgoing Cerebellar: UTA Gait: deferred   Lab Results:  Imaging: Ct Head Code Stroke Wo Contrast  Result Date: 08/02/2018 CLINICAL DATA:  Code stroke. LEFT-sided weakness. Unresponsive. Dementia. EXAM: CT HEAD WITHOUT CONTRAST TECHNIQUE: Contiguous axial images were obtained from the base of the skull through the vertex without intravenous contrast. COMPARISON:  07/11/2018. FINDINGS: Brain: No evidence of acute infarction, hemorrhage, extra-axial collection or mass lesion/mass effect. Advanced atrophy. Hydrocephalus ex vacuo. Hypoattenuation of white matter, consistent with small vessel disease. Chronic infarcts involve the basal ganglia and pons, but no cortical hypoattenuation appears acute. Marked brainstem atrophy. Punctate area of brainstem calcification, stable. Vascular: Calcification of the cavernous internal carotid arteries and distal vertebral arteries consistent with cerebrovascular atherosclerotic disease. No signs of intracranial large vessel occlusion. Skull: Normal. Negative for fracture or focal lesion. Sinuses/Orbits: No acute finding. Other: None. ASPECTS [facility] Stroke Program Early CT Score) - Ganglionic level infarction (caudate, lentiform nuclei, internal capsule, insula, M1-M3 cortex): 7 - Supraganglionic infarction (M4-M6 cortex): 3 Total score (0-10 with 10 being normal): 10 IMPRESSION: 1. Atrophy and small vessel disease. Remote deep white matter infarcts. No acute findings are evident. Calcific cerebrovascular atherosclerotic disease. 2. ASPECTS is 10. These results were communicated to Dr. Otelia Limes at 12:19 pmon 9/28/2019by text page via the [facility] messaging system. Electronically Signed   By: Elsie Stain M.D.   On:  08/02/2018 12:21    Valentina Lucks, MSN, NP-C [facility] [phone] 08/02/2018, 12:28 PM    Assessment: 72 y.o. female with a PMH of stroke (2013), HTN, HLD, seizures, DM and Alzheimer's disease who presented to the [facility] ED as a code stroke for unresponsiveness and left sided weakness.   1. CT head: No hemorrhage.  2. Given that exam was not consistent with LVO, CTA was not obtained.  3. Patient on Xarelto and not a candidate for tPA.  4. Most likely component of the DDx is felt to be syncope; but further AMS work-up needed.  5. History of stroke with residual left sided weakness.  6. Stroke Risk Factors - prior stroke, diabetes mellitus, hyperlipidemia and hypertension  Recommendations:  --Telemetry monitoring --Frequent neuro checks --Obtain ammonia, TSH, Sodium, B12, U/A, thiamine level, LFTs --MRI brain  I have seen and examined the patient. I have amended the exam, assessment and recommendations above.  Electronically signed: Dr. Caryl Pina

## 2018-08-02 NOTE — ED Notes (Signed)
Pt's husband given discharge instructions, will meet pt at home. [facility] notified for transport-

## 2018-08-02 NOTE — ED Notes (Signed)
[facility] contacted for tx 

## 2018-08-04 LAB — URINE CULTURE

## 2018-08-05 ENCOUNTER — Telehealth: Payer: Self-pay | Admitting: *Deleted

## 2018-08-05 NOTE — Telephone Encounter (Signed)
Post ED Visit - Positive Culture Follow-up  Culture report reviewed by antimicrobial stewardship pharmacist:  [x]  Enzo Bi, Pharm.D. []  Celedonio Miyamoto, Pharm.D., BCPS AQ-ID []  Garvin Fila, Pharm.D., BCPS []  Georgina Pillion, Pharm.D., BCPS []  [place], [address].D., BCPS, AAHIVP []  Estella Husk, Pharm.D., BCPS, AAHIVP []  Lysle Pearl, PharmD, BCPS []  Phillips Climes, PharmD, BCPS []  Agapito Games, PharmD, BCPS []  Verlan Friends, PharmD  Positive urine culture Treated with Cephalexin, organism sensitive to the same and no further patient follow-up is required at this time.  Virl Axe [facility] 08/05/2018, 9:34 AM

## 2019-02-20 IMAGING — MR MR HEAD W/O CM
10 series · 48 of 48 positions shown · non-contrast
Comparison: CT head without contrast from the same day. MRI brain
07/22/2012

CLINICAL DATA: Altered level of consciousness. Fall. Abnormal CT
scan.

EXAM:
MRI HEAD WITHOUT CONTRAST
TECHNIQUE: Multiplanar, multiecho pulse sequences of the brain and surrounding
structures were obtained without intravenous contrast.

[Series 2: T1 · sagittal · 5.0mm · 0.90mm/px · 4 of 29 slices shown (1 of 2)]
[im 1/29]
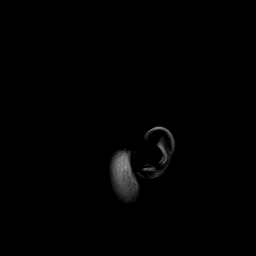
[im 10/29]
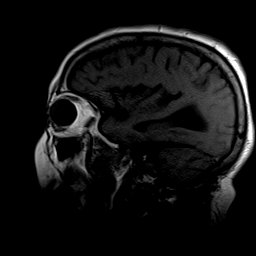
[im 19/29]
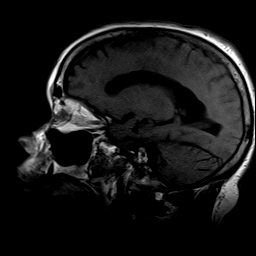
[im 29/29]
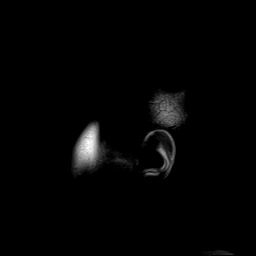

[Series 4: DWI · axial · 3.0mm · 1.80mm/px · z∈[-26,+130]mm · 7 of 55 slices shown (1 of 2)]
[im 1/55]
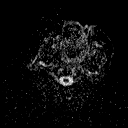
[im 10/55]
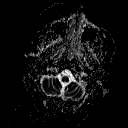
[im 19/55]
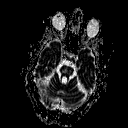
[im 28/55]
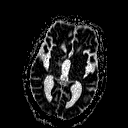
[im 37/55]
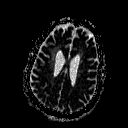
[im 46/55]
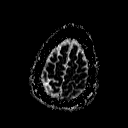
[im 55/55]
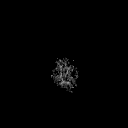

[Series 6: DWI · coronal · 3.0mm · 1.80mm/px · 5 of 45 slices shown (2 of 2)]
[im 1/45]
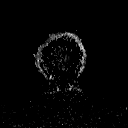
[im 12/45]
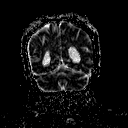
[im 23/45]
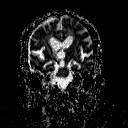
[im 34/45]
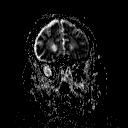
[im 45/45]
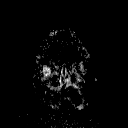

[Series 7: T2 · axial · 5.0mm · 0.90mm/px · z∈[-28,+135]mm · 3 of 27 slices shown (1 of 3)]
[im 1/27]
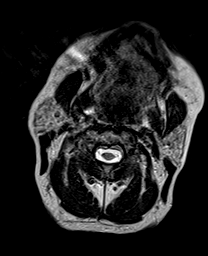
[im 14/27]
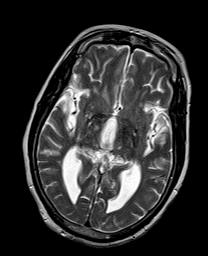
[im 27/27]
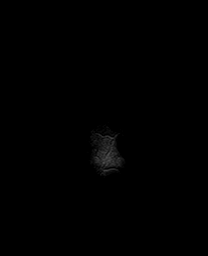

[Series 8: FLAIR · axial · 5.0mm · 0.45mm/px · z∈[-28,+135]mm · 3 of 27 slices shown]
[im 1/27]
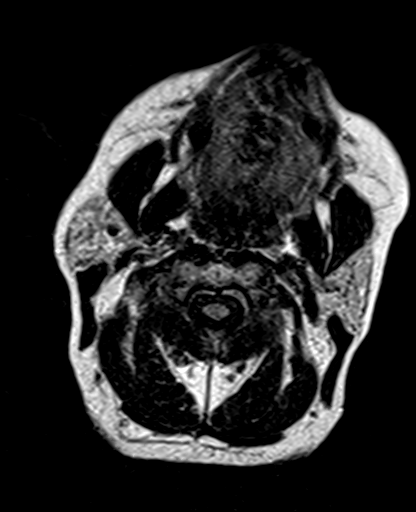
[im 14/27]
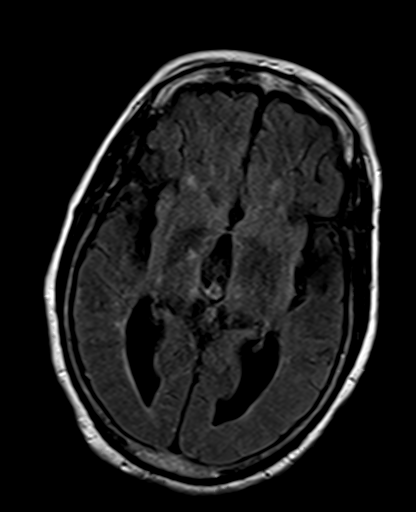
[im 27/27]
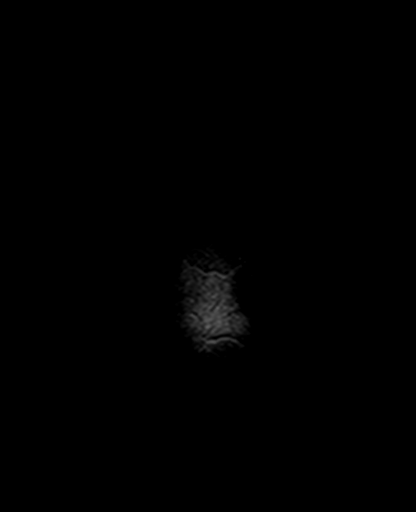

[Series 9: T2 · axial · 5.0mm · 0.45mm/px · z∈[-28,+135]mm · 3 of 27 slices shown (2 of 3)]
[im 1/27]
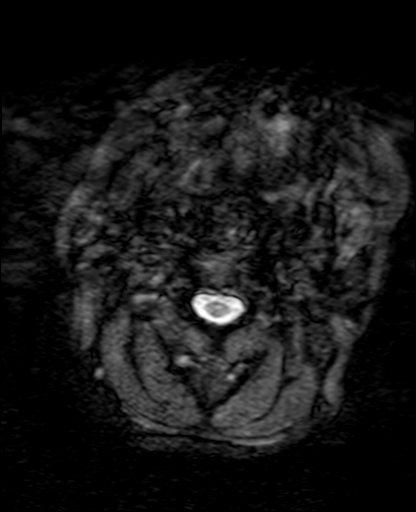
[im 14/27]
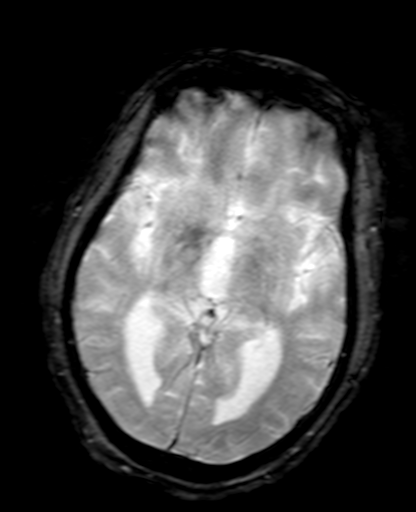
[im 27/27]
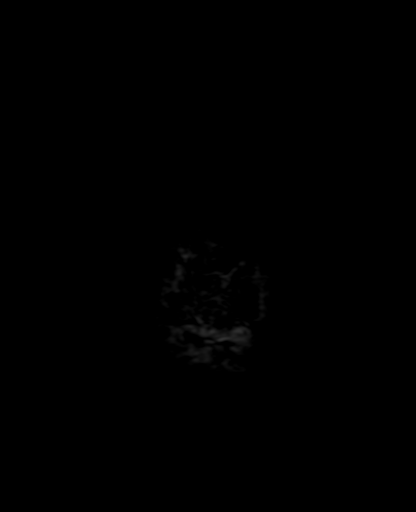

[Series 10: T1 · axial · 3.0mm · 1.00mm/px · z∈[-29,+142]mm · 7 of 60 slices shown (2 of 2)]
[im 1/60]
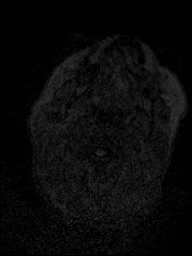
[im 10/60]
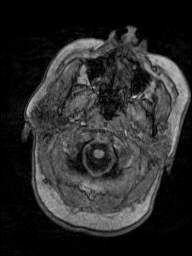
[im 20/60]
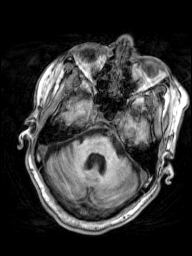
[im 30/60]
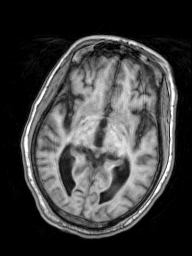
[im 40/60]
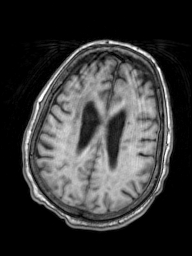
[im 50/60]
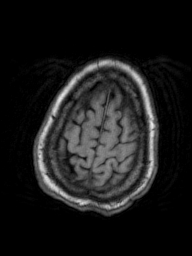
[im 60/60]
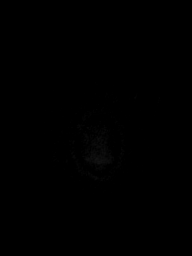

[Series 11: T2 · coronal · 5.0mm · 0.86mm/px · 4 of 31 slices shown (3 of 3)]
[im 1/31]
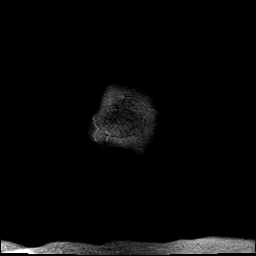
[im 11/31]
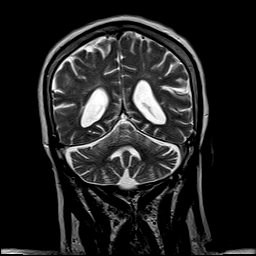
[im 21/31]
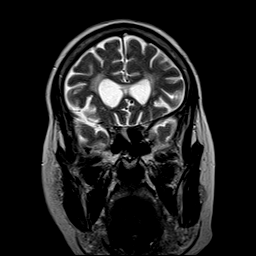
[im 31/31]
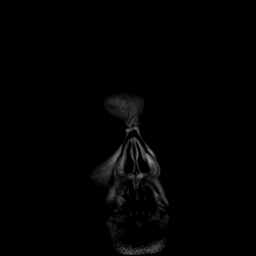

[Series 100: ax (id) · axial · 3.0mm · 1.80mm/px · z∈[-26,+130]mm · 7 of 55 slices shown]
[im 1/55]
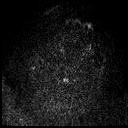
[im 10/55]
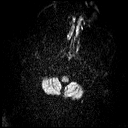
[im 19/55]
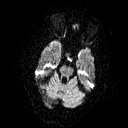
[im 28/55]
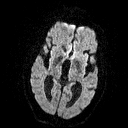
[im 37/55]
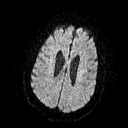
[im 46/55]
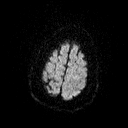
[im 55/55]
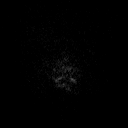

[Series 101: cor (id) · coronal · 3.0mm · 1.80mm/px · 5 of 45 slices shown]
[im 1/45]
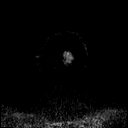
[im 12/45]
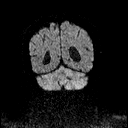
[im 23/45]
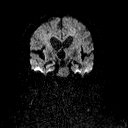
[im 34/45]
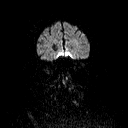
[im 45/45]
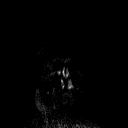

[48 of 48 positions shown; findings below may reference images not displayed]

FINDINGS: Brain: The diffusion-weighted images demonstrate no acute or
subacute infarction. Stable extensive remote white matter disease is
evident in the pons. No discrete hemorrhage is present. Remote
lacunar infarcts are present in the basal ganglia bilaterally, right
greater than left. Ex vacuo dilation is associated. Extensive
periventricular white matter disease is noted bilaterally. No
significant extra-axial fluid collection is present. The internal
auditory canals are within normal limits bilaterally.

Vascular: Flow is present in the major intracranial arteries.

Skull and upper cervical spine: The skull base is otherwise within
normal limits. The craniocervical junction is normal. The upper
cervical spine is within normal limits.

Sinuses/Orbits: The paranasal sinuses are clear. Mastoid effusions
are present bilaterally. No obstructing nasopharyngeal lesion is
present. Globes and orbits are within normal limits.
IMPRESSION: 1. No acute intracranial abnormality.
2. No evidence for significant hemorrhage of the brainstem. This may
be focal calcification related to prior infarct.
3. Remote infarcts involving the pons and right greater than left
basal ganglia.

## 2019-02-20 IMAGING — CT CT HEAD W/O CM
3 series · 15 of 46 positions shown, 18 images · non-contrast
Comparison: 02/05/2013

CLINICAL DATA: Previous history of stroke with worsening of
lethargy. Fell in the bathroom.

EXAM:
CT HEAD WITHOUT CONTRAST
TECHNIQUE: Contiguous axial images were obtained from the base of the skull
through the vertex without intravenous contrast.

[Series 3: head wo · axial · 0.43mm/px · z∈[-114,+6]mm · 9 of 29 slices shown, 12 images]
[im 3/29  brain]
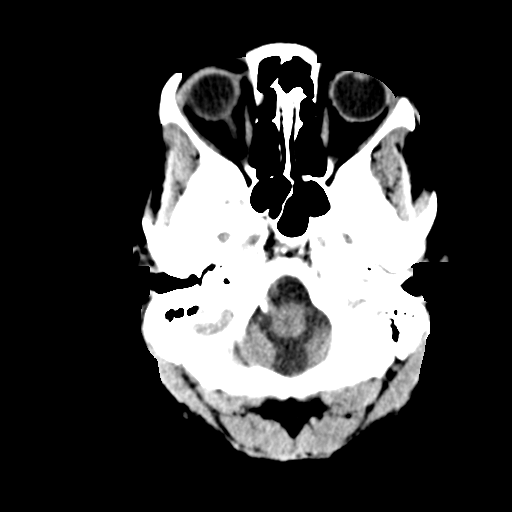
[im 3/29  bone]
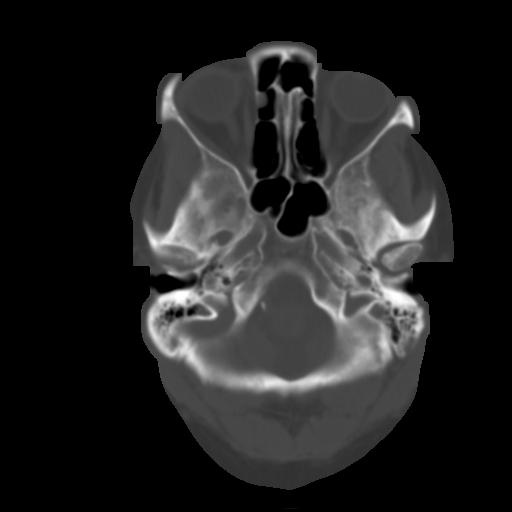
[im 6/29  brain]
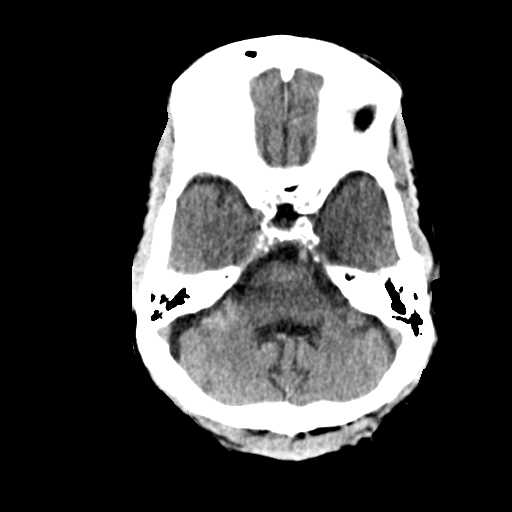
[im 9/29  brain]
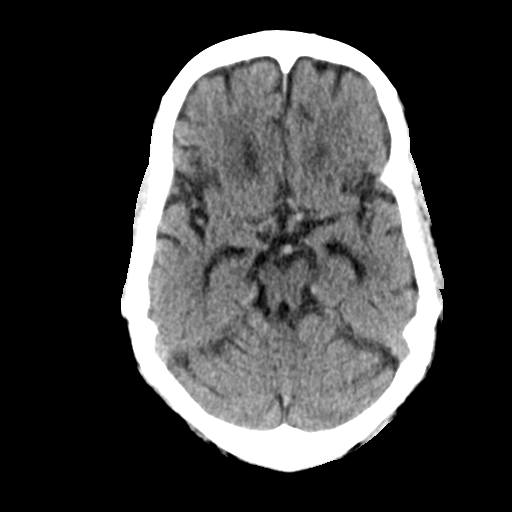
[im 12/29  brain]
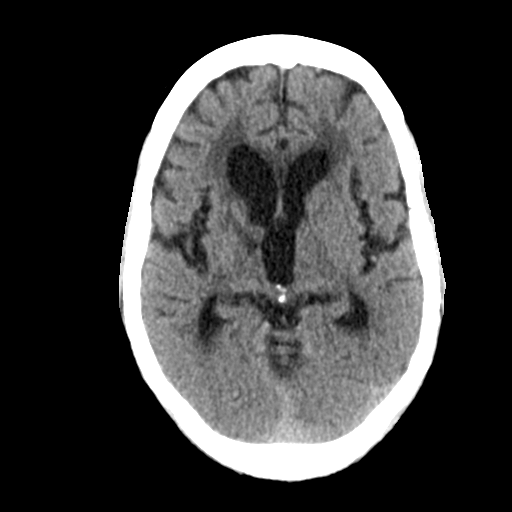
[im 15/29  brain]
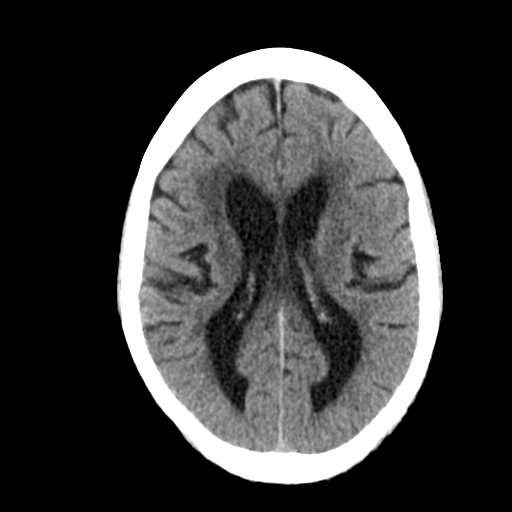
[im 15/29  bone]
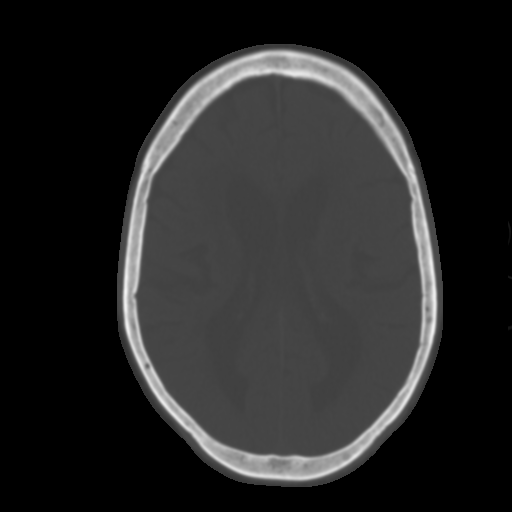
[im 18/29  brain]
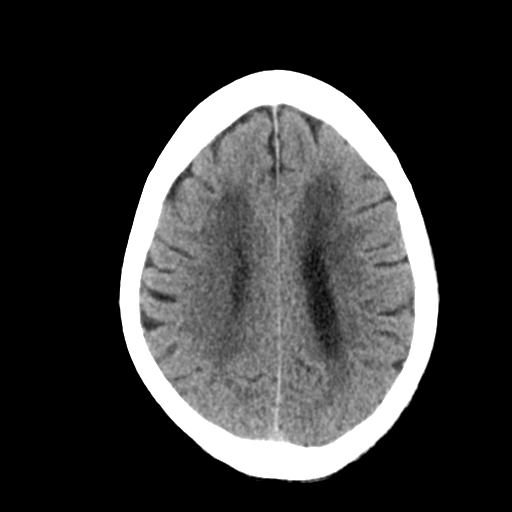
[im 21/29  brain]
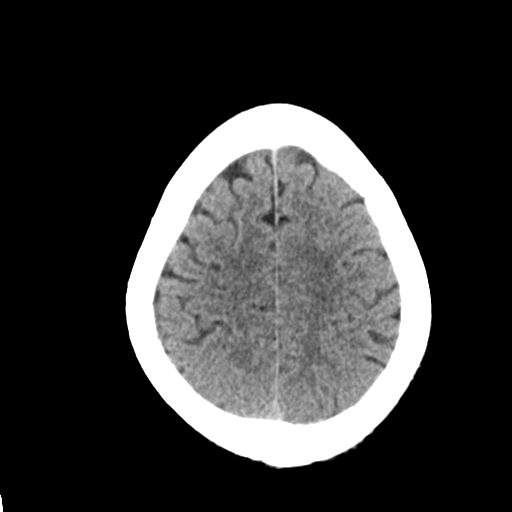
[im 24/29  brain]
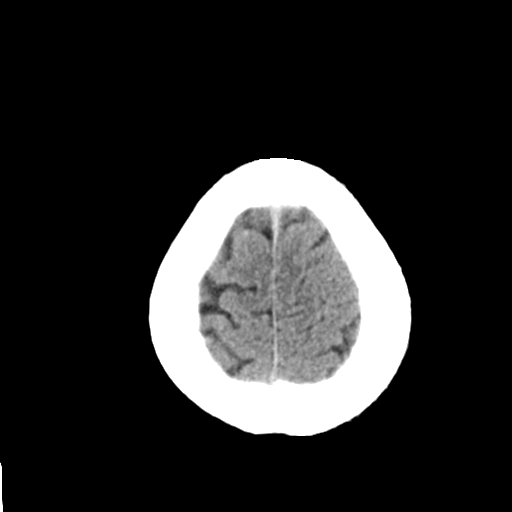
[im 27/29  brain]
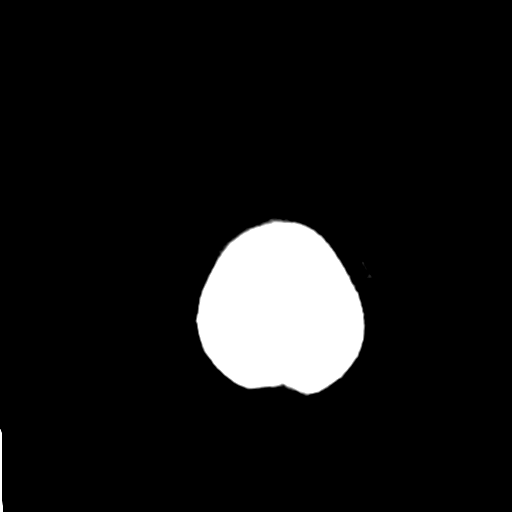
[im 27/29  bone]
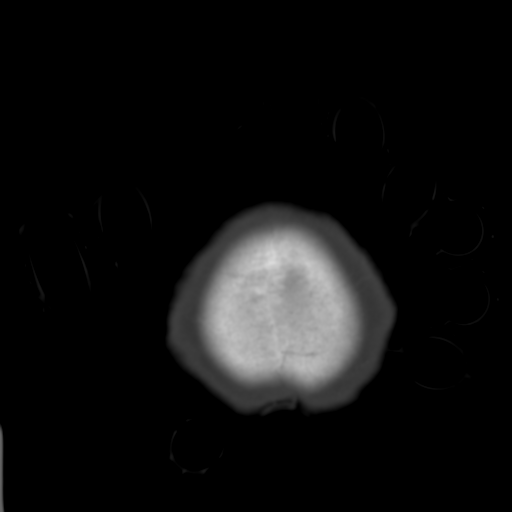

[Series 4: coronal soft tissue · coronal · 0.29mm/px · 3 of 66 slices shown]
[im 22/66  brain]
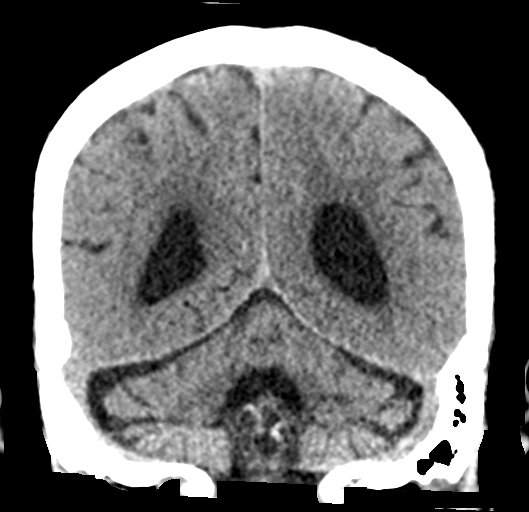
[im 29/66  brain]
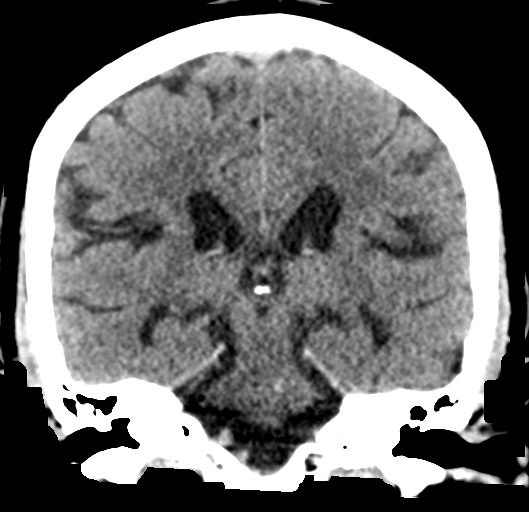
[im 37/66  brain]
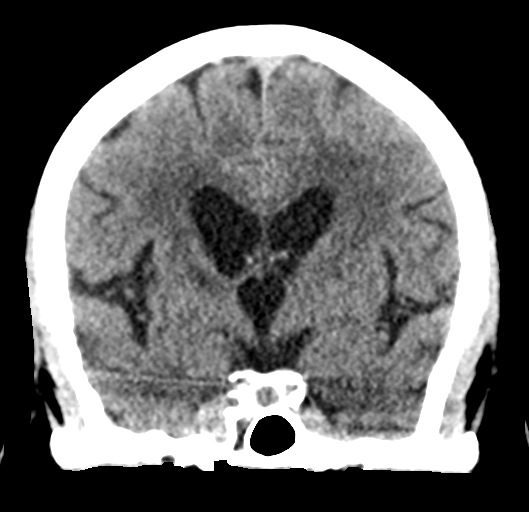

[Series 5: sagittal soft tissue · sagittal · 0.29mm/px · 3 of 51 slices shown]
[im 17/51  brain]
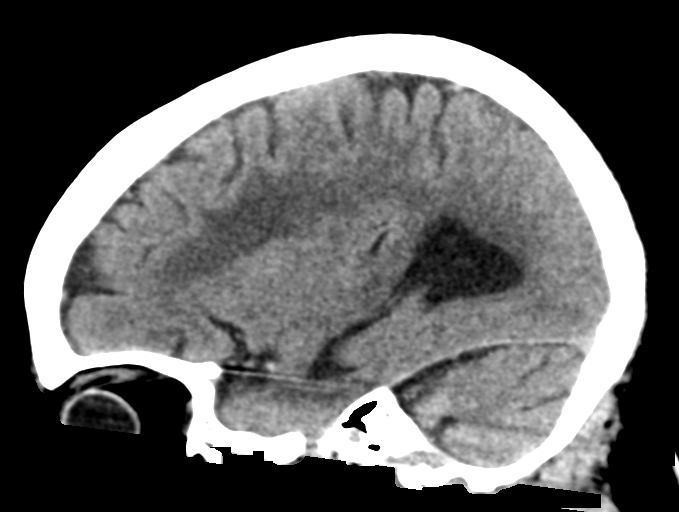
[im 26/51  brain]
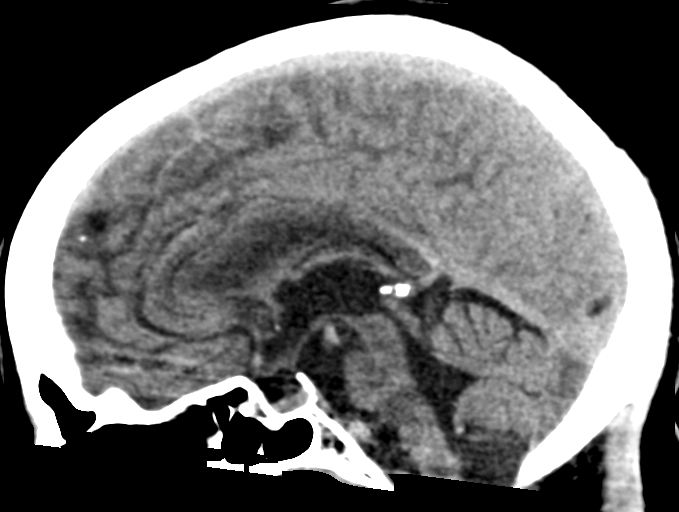
[im 34/51  brain]
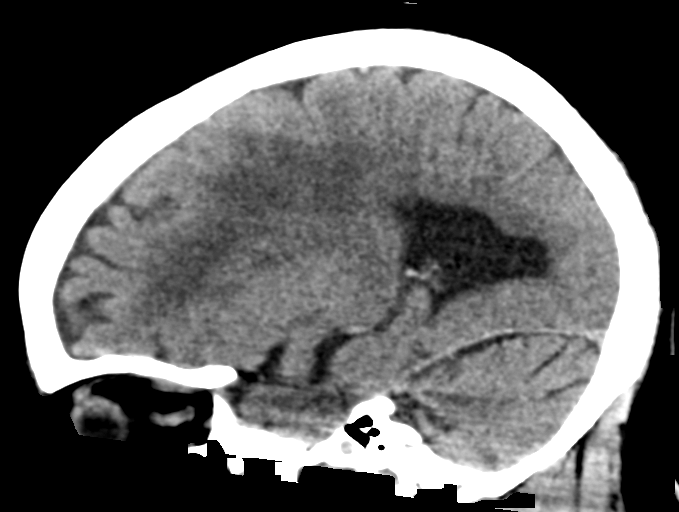

[15 of 46 positions shown; findings below may reference images not displayed]

FINDINGS: Brain: Generalized brain atrophy. Chronic small-vessel ischemic
changes throughout the cerebral hemispheric white matter. Chronic
atrophy and ischemic changes of the brainstem. Tiny brainstem
hyperdensity on 1 image could be calcification or a tiny hemorrhage.
Old infarction in the right basal ganglia and internal capsule
region. No sign of acute infarction, mass lesion, subarachnoid
hemorrhage, hydrocephalus or extra-axial collection.

Vascular: There is atherosclerotic calcification of the major
vessels at the base of the brain.

Skull: Normal

Sinuses/Orbits: Clear/normal

Other: None
IMPRESSION: No definite acute finding. Brain atrophy including pronounced
atrophic changes of the brainstem/midbrain.. Old ischemic changes
throughout the hemispheric white matter with old infarction in the
right basal ganglia, thalami and throughout the brainstem. Punctate
hyperdensity in the left side of the pons could be chronic
calcification related to the previous infarctions, though a left
pontine microhemorrhage is not excluded.

## 2019-02-23 IMAGING — CR DG CHEST 2V
2 series · 2 of 2 positions shown · non-contrast
Comparison: Chest x-ray dated December 28, 2017.

CLINICAL DATA: Weakness.

EXAM:
CHEST  2 VIEW

[chest lat]
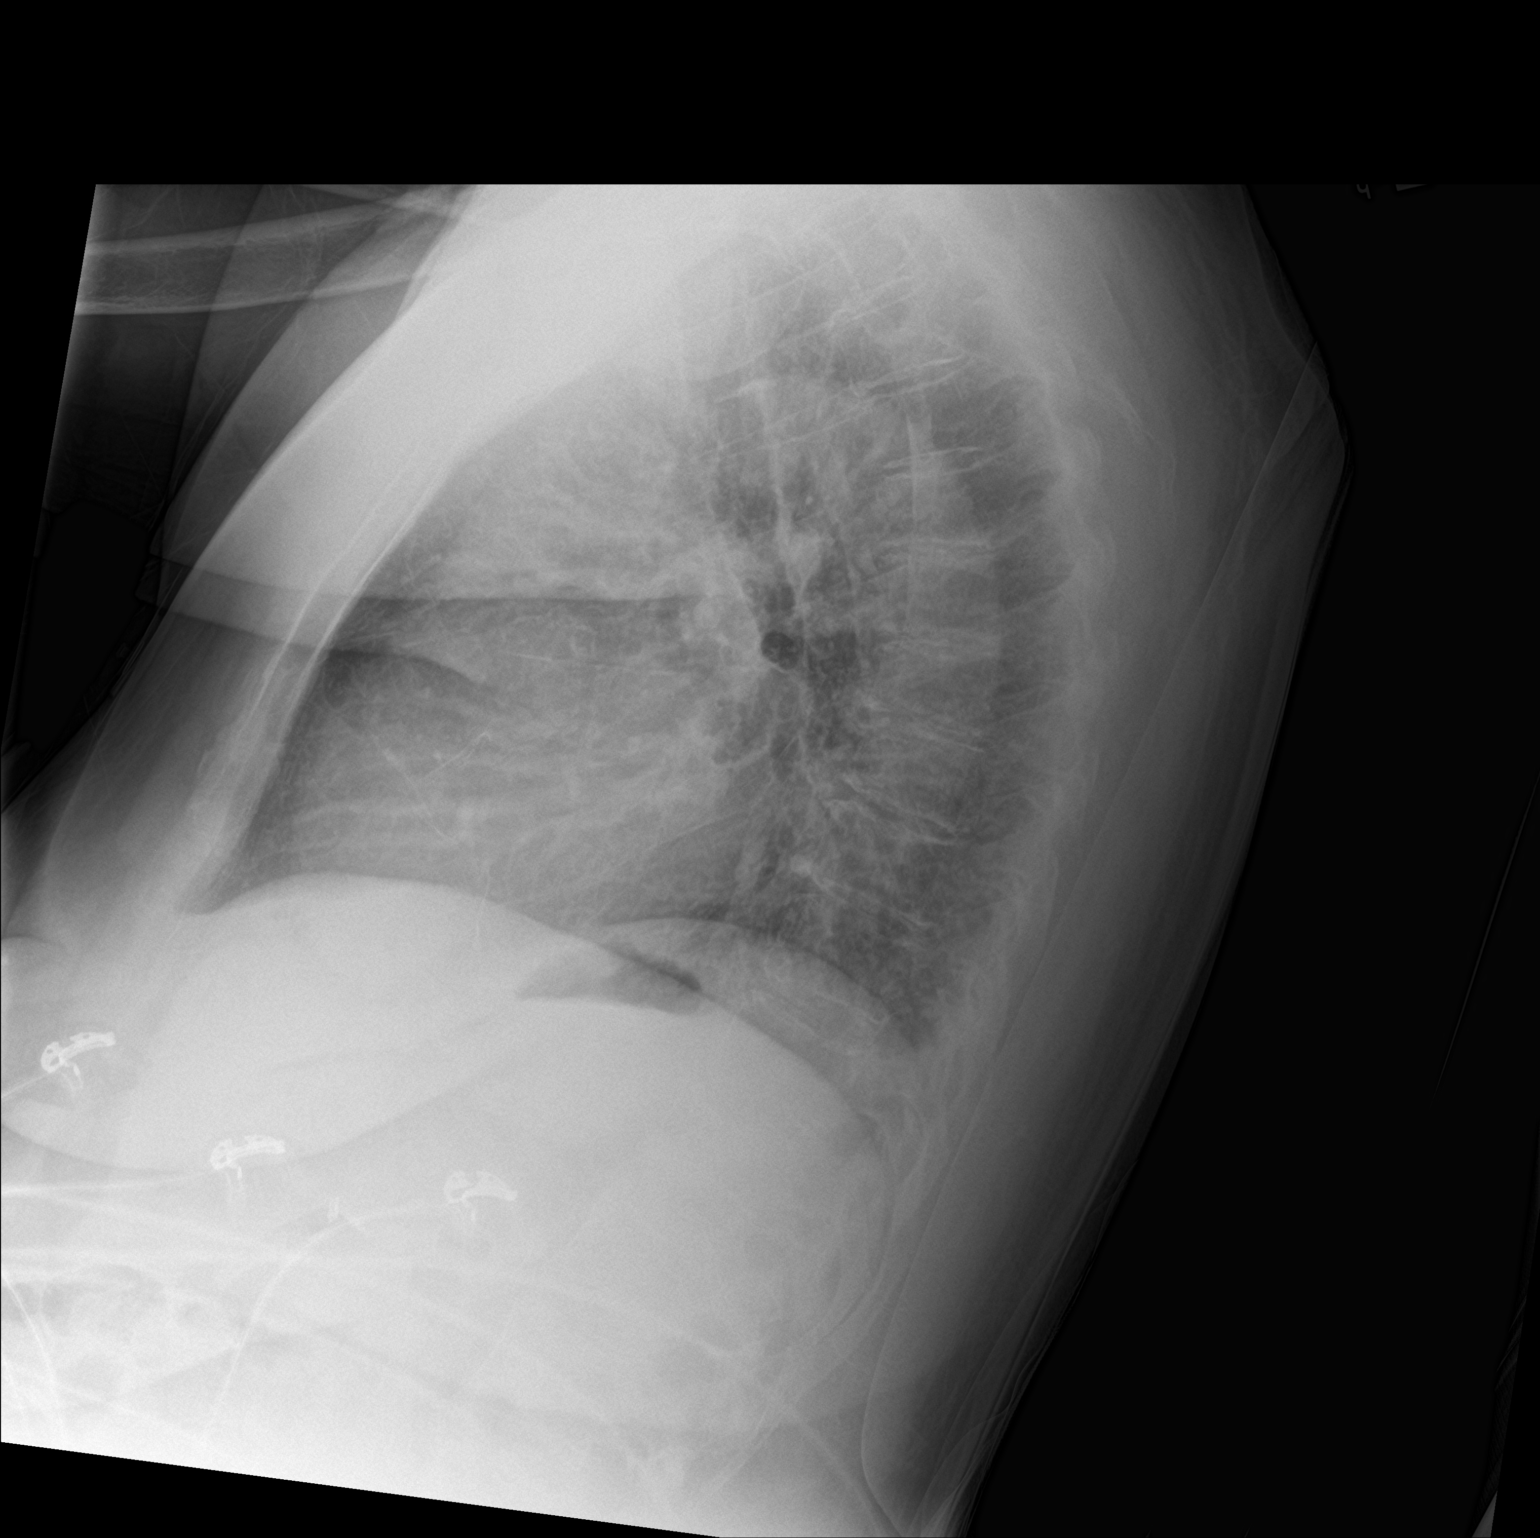

[chest ap]
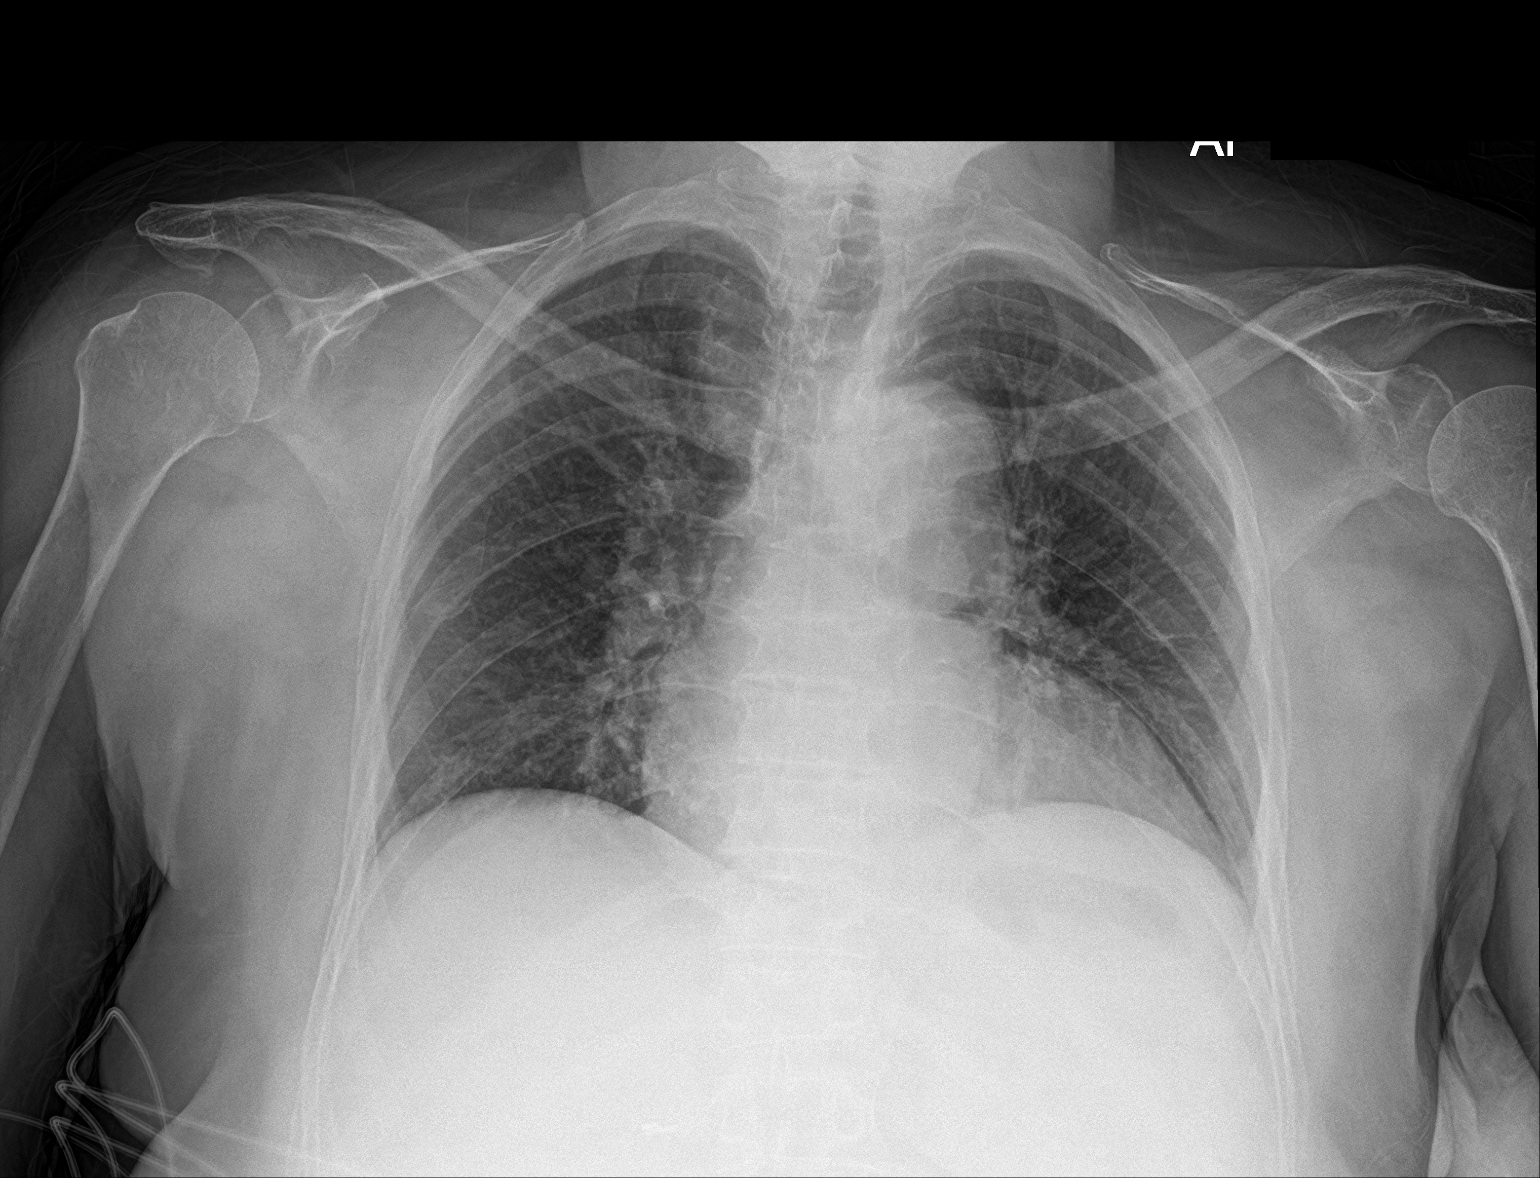

[2 of 2 positions shown; findings below may reference images not displayed]

FINDINGS: The heart size and mediastinal contours are within normal limits.
Normal pulmonary vascularity. Low lung volumes. Mild bibasilar
atelectasis. No focal consolidation, pleural effusion, or
pneumothorax. No acute osseous abnormality.
IMPRESSION: Bibasilar atelectasis.  No active cardiopulmonary disease.

## 2021-12-06 DEATH — deceased
